# Patient Record
Sex: Male | Born: 1955 | Race: White | Hispanic: No | Marital: Married | Smoking: Former smoker
Health system: Southern US, Community
[De-identification: ages and names within clinical notes are randomized; demographics above are authoritative.]

## PROBLEM LIST (undated history)

## (undated) DIAGNOSIS — E785 Hyperlipidemia, unspecified: Secondary | ICD-10-CM

## (undated) DIAGNOSIS — T7840XA Allergy, unspecified, initial encounter: Secondary | ICD-10-CM

## (undated) HISTORY — DX: Allergy, unspecified, initial encounter: T78.40XA

## (undated) HISTORY — DX: Hyperlipidemia, unspecified: E78.5

---

## 1998-05-14 DIAGNOSIS — J301 Allergic rhinitis due to pollen: Secondary | ICD-10-CM | POA: Insufficient documentation

## 2004-05-14 HISTORY — PX: HERNIA REPAIR: SHX51

## 2005-03-02 ENCOUNTER — Ambulatory Visit: Payer: Self-pay | Admitting: General Surgery

## 2009-05-13 ENCOUNTER — Ambulatory Visit: Payer: Self-pay | Admitting: Gastroenterology

## 2009-07-18 DIAGNOSIS — R42 Dizziness and giddiness: Secondary | ICD-10-CM | POA: Insufficient documentation

## 2013-03-13 ENCOUNTER — Ambulatory Visit: Payer: Self-pay | Admitting: Unknown Physician Specialty

## 2013-03-13 LAB — HM COLONOSCOPY

## 2014-05-14 HISTORY — PX: COLONOSCOPY: SHX174

## 2014-12-29 ENCOUNTER — Other Ambulatory Visit: Payer: Self-pay | Admitting: Family Medicine

## 2015-01-31 ENCOUNTER — Other Ambulatory Visit: Payer: Self-pay | Admitting: Family Medicine

## 2015-01-31 ENCOUNTER — Telehealth: Payer: Self-pay | Admitting: Family Medicine

## 2015-01-31 ENCOUNTER — Encounter: Payer: Self-pay | Admitting: Family Medicine

## 2015-01-31 DIAGNOSIS — E782 Mixed hyperlipidemia: Secondary | ICD-10-CM | POA: Insufficient documentation

## 2015-01-31 DIAGNOSIS — E559 Vitamin D deficiency, unspecified: Secondary | ICD-10-CM | POA: Insufficient documentation

## 2015-01-31 DIAGNOSIS — E785 Hyperlipidemia, unspecified: Secondary | ICD-10-CM

## 2015-01-31 NOTE — Telephone Encounter (Signed)
Printed labs and placed them at the front desk.  Left message to pt to call back. Will try again later.  Thanks,

## 2015-01-31 NOTE — Telephone Encounter (Signed)
Pt would like to get a lab slip so he can have his labs done and the results back before his f/u appt on 02/07/15. Thanks TNP

## 2015-01-31 NOTE — Telephone Encounter (Signed)
Please order lipids, glucose, and TSH under hyperlipidemia, and vitamin d 25-oh under vitamin D deficiency.   I tried putting in this orders, but Epic would not let me sign them.   Thanks.

## 2015-02-02 LAB — VITAMIN D 25 HYDROXY (VIT D DEFICIENCY, FRACTURES): Vit D, 25-Hydroxy: 26.9 ng/mL — ABNORMAL LOW (ref 30.0–100.0)

## 2015-02-02 LAB — LIPID PANEL
CHOLESTEROL TOTAL: 198 mg/dL (ref 100–199)
Chol/HDL Ratio: 5 ratio units (ref 0.0–5.0)
HDL: 40 mg/dL (ref >39)
LDL CALC: 120 mg/dL — AB (ref 0–99)
TRIGLYCERIDES: 190 mg/dL — AB (ref 0–149)
VLDL CHOLESTEROL CAL: 38 mg/dL (ref 5–40)

## 2015-02-02 LAB — GLUCOSE, RANDOM: Glucose: 82 mg/dL (ref 65–99)

## 2015-02-02 LAB — TSH: TSH: 2.78 u[IU]/mL (ref 0.450–4.500)

## 2015-02-07 ENCOUNTER — Encounter: Payer: Self-pay | Admitting: Family Medicine

## 2015-02-07 ENCOUNTER — Ambulatory Visit (INDEPENDENT_AMBULATORY_CARE_PROVIDER_SITE_OTHER): Payer: Managed Care, Other (non HMO) | Admitting: Family Medicine

## 2015-02-07 VITALS — BP 120/70 | HR 68 | Temp 98.5°F | Resp 16 | Ht 68.0 in | Wt 188.0 lb

## 2015-02-07 DIAGNOSIS — E782 Mixed hyperlipidemia: Secondary | ICD-10-CM | POA: Diagnosis not present

## 2015-02-07 DIAGNOSIS — D229 Melanocytic nevi, unspecified: Secondary | ICD-10-CM | POA: Insufficient documentation

## 2015-02-07 DIAGNOSIS — H9319 Tinnitus, unspecified ear: Secondary | ICD-10-CM | POA: Insufficient documentation

## 2015-02-07 DIAGNOSIS — E559 Vitamin D deficiency, unspecified: Secondary | ICD-10-CM

## 2015-02-07 MED ORDER — ATORVASTATIN CALCIUM 40 MG PO TABS: 40.0000 mg | ORAL_TABLET | Freq: Every evening | ORAL | Status: DC

## 2015-02-07 NOTE — Progress Notes (Signed)
Patient ID: Derek Jimenez, male   DOB: 05/28/1955, 59 y.o.   MRN: [id number]        Patient: Derek Jimenez Male    DOB: Jan 16, 1956   59 y.o.   MRN: [id number] Visit Date: 02/07/2015  Today's Provider: Lelon Huh, MD   Chief Complaint  Patient presents with  . Hyperlipidemia   Subjective:    HPI   Lipid/Cholesterol, Follow-up:   Last seen for this1 years ago.  Management changes since that visit include none. . Last Lipid Panel:    Component Value Date/Time   CHOL 198 02/01/2015 1556   TRIG 190* 02/01/2015 1556   HDL 40 02/01/2015 1556   CHOLHDL 5.0 02/01/2015 1556   LDLCALC 120* 02/01/2015 1556    Risk factors for vascular disease include hypercholesterolemia  He reports excellent compliance with treatment. He is not having side effects.  Current symptoms include none and have been stable. Weight trend: stable Prior visit with dietician: no Current diet: in general, a "healthy" diet   Current exercise: walking  Wt Readings from Last 3 Encounters:  02/07/15 188 lb (85.276 kg)  11/18/13 186 lb (84.369 kg)    -------------------------------------------------------------------      Allergies  Allergen Reactions  . Meclizine Other (See Comments)   Previous Medications   ASPIRIN 81 MG TABLET    Take 1 tablet by mouth daily.   ATORVASTATIN (LIPITOR) 40 MG TABLET    TAKE ONE TABLET BY MOUTH IN THE EVENING   CHOLECALCIFEROL (VITAMIN D) 2000 UNITS TABLET    Take 1 tablet by mouth daily.    Review of Systems  Constitutional: Negative for fever, chills and appetite change.  Respiratory: Negative for chest tightness, shortness of breath and wheezing.   Cardiovascular: Negative for chest pain and palpitations.  Gastrointestinal: Negative for nausea, vomiting and abdominal pain.    Social History  Substance Use Topics  . Smoking status: Former Research scientist (life sciences)  . Smokeless tobacco: Never Used  . Alcohol Use: No   Objective:   BP 120/70 mmHg  Pulse 68  Temp(Src)  98.5 F (36.9 C) (Oral)  Resp 16  Ht 5\' 8"  (1.727 m)  Wt 188 lb (85.276 kg)  BMI 28.59 kg/m2  SpO2 97%  Physical Exam  General Appearance:    Alert, cooperative, no distress  Eyes:    PERRL, conjunctiva/corneas clear, EOM's intact       Lungs:     Clear to auscultation bilaterally, respirations unlabored  Heart:    Regular rate and rhythm  Neurologic:   Awake, alert, oriented x 3. No apparent focal neurological           defect.          Assessment & Plan:     1. Mixed hyperlipidemia He is tolerating atorvastatin well with no adverse effects.    2. Vitamin D deficiency Stable, continue current vitamin D supplement.        Lelon Huh, MD  [facility]

## 2015-08-29 ENCOUNTER — Encounter: Payer: Self-pay | Admitting: Family Medicine

## 2015-08-29 ENCOUNTER — Ambulatory Visit (INDEPENDENT_AMBULATORY_CARE_PROVIDER_SITE_OTHER): Payer: Managed Care, Other (non HMO) | Admitting: Family Medicine

## 2015-08-29 VITALS — BP 108/60 | HR 82 | Temp 98.2°F | Resp 16 | Wt 189.0 lb

## 2015-08-29 DIAGNOSIS — R1032 Left lower quadrant pain: Secondary | ICD-10-CM

## 2015-08-29 DIAGNOSIS — R1030 Lower abdominal pain, unspecified: Secondary | ICD-10-CM | POA: Diagnosis not present

## 2015-08-29 MED ORDER — METRONIDAZOLE 500 MG PO TABS: 500.0000 mg | ORAL_TABLET | Freq: Two times a day (BID) | ORAL | Status: AC

## 2015-08-29 MED ORDER — CIPROFLOXACIN HCL 500 MG PO TABS: 500.0000 mg | ORAL_TABLET | Freq: Two times a day (BID) | ORAL | Status: AC

## 2015-08-29 NOTE — Progress Notes (Signed)
Patient: Derek Jimenez Male    DOB: 1956/01/28   60 y.o.   MRN: [id number] Visit Date: 08/29/2015  Today's Provider: Lelon Huh, MD   Chief Complaint  Patient presents with  . Abdominal Pain   Subjective:    Abdominal Pain This is a recurrent problem. The current episode started more than 1 month ago (approximatly 2 months). The problem occurs intermittently. The problem has been gradually worsening. Pain location: left inguinal area and lower abdomen. The quality of the pain is dull and cramping. Associated symptoms include constipation and a fever. Pertinent negatives include no anorexia, arthralgias, belching, diarrhea, dysuria, flatus, frequency, headaches, hematuria, melena, myalgias, nausea or vomiting. The pain is aggravated by eating. Treatments tried: Laxative. The treatment provided mild relief. Inguinal hernia  Patient states he has trouble passing gas. He also states he feels a pouch like formation in his left inguinal area that seems to fills up with fluid. Patient says if he presses on that area the fluid passes out of the pouch and the pain is relieved. He has been more constipated the last few months, but states if he takes fiber supplements that pain seems to subside. He states that there is constant discomfort in the area, but that it flares up and becomes severe every couple of days. He feels a mild discomfort today.      Allergies  Allergen Reactions  . Meclizine Other (See Comments)   Previous Medications   ASPIRIN 81 MG TABLET    Take 1 tablet by mouth daily.   ATORVASTATIN (LIPITOR) 40 MG TABLET    Take 1 tablet (40 mg total) by mouth every evening.   CHOLECALCIFEROL (VITAMIN D) 2000 UNITS TABLET    Take 1 tablet by mouth daily.    Review of Systems  Constitutional: Positive for fever. Negative for chills, appetite change and fatigue.  Respiratory: Negative for chest tightness, shortness of breath and wheezing.   Cardiovascular: Negative for chest pain  and palpitations.  Gastrointestinal: Positive for abdominal pain, constipation and abdominal distention. Negative for nausea, vomiting, diarrhea, blood in stool, melena, anal bleeding, anorexia and flatus.  Genitourinary: Negative for dysuria, frequency and hematuria.  Musculoskeletal: Negative for myalgias and arthralgias.  Neurological: Negative for dizziness, light-headedness and headaches.    Social History  Substance Use Topics  . Smoking status: Former Research scientist (life sciences)  . Smokeless tobacco: Never Used  . Alcohol Use: No   Objective:   BP 108/60 mmHg  Pulse 82  Temp(Src) 98.2 F (36.8 C) (Oral)  Resp 16  Wt 189 lb (85.73 kg)  SpO2 96%  Physical Exam  General Appearance:    Alert, cooperative, no distress  Eyes:    PERRL, conjunctiva/corneas clear, EOM's intact       Lungs:     Clear to auscultation bilaterally, respirations unlabored  Heart:    Regular rate and rhythm  Abdomen:   bowel sounds present and normal in all 4 quadrants, round, nontender or nondistended. No CVA tenderness, no inguinal or femoral hernias noted        Assessment & Plan:      1. Left inguinal pain By history is very suspicious for inguinal hernia, which he has had repaired in the past, However no hernia is appreciated on exam. He also has a history of known rectal-sigmoid diverticulosis on colonoscopy in 2014. Discussed options of referral to surgeon, CT scan, or empirical treatment for diverticulitis. He prefers to try antibiotics for possible diverticulitis. If  not resolved after a week will proceed with CT scan.    - metroNIDAZOLE (FLAGYL) 500 MG tablet; Take 1 tablet (500 mg total) by mouth 2 (two) times daily.  Dispense: 20 tablet; Refill: 0 - ciprofloxacin (CIPRO) 500 MG tablet; Take 1 tablet (500 mg total) by mouth 2 (two) times daily.  Dispense: 20 tablet; Refill: 0        Lelon Huh, MD  [facility]

## 2015-08-29 NOTE — Patient Instructions (Signed)
Call if not much better within 7 days.

## 2015-09-19 ENCOUNTER — Telehealth: Payer: Self-pay | Admitting: Family Medicine

## 2015-09-19 DIAGNOSIS — R1032 Left lower quadrant pain: Secondary | ICD-10-CM

## 2015-09-19 NOTE — Telephone Encounter (Signed)
Please order CT pelvis for left inguinal pain. Rule out inguinal hernia/tumor/mass.

## 2015-09-19 NOTE — Telephone Encounter (Signed)
Pt states he was just seen 08/29/2015 for stomach pain.  Pt states he is still having stomach pain.  Pt states Dr Caryn Section had talked about doing a scan if he was still having pain.  Pt is requesting a scan ordered.  CB#[phone]/MW

## 2015-09-20 ENCOUNTER — Telehealth: Payer: Self-pay | Admitting: Family Medicine

## 2015-09-20 DIAGNOSIS — R1030 Lower abdominal pain, unspecified: Secondary | ICD-10-CM

## 2015-09-20 NOTE — Telephone Encounter (Signed)
Per tech at [facility] this test should be abd/pelvis with contrast,Thanks

## 2015-09-20 NOTE — Telephone Encounter (Signed)
[place] states this needs to be CT abd/pelvis,Thanks

## 2015-09-20 NOTE — Telephone Encounter (Signed)
Order in epic. 

## 2015-09-20 NOTE — Telephone Encounter (Signed)
New order placed

## 2015-09-22 ENCOUNTER — Ambulatory Visit
Admission: RE | Admit: 2015-09-22 | Discharge: 2015-09-22 | Disposition: A | Discharge status: Home / Self Care | Payer: Managed Care, Other (non HMO) | Source: Ambulatory Visit | Attending: Family Medicine | Admitting: Family Medicine

## 2015-09-22 ENCOUNTER — Other Ambulatory Visit: Payer: Self-pay | Admitting: Family Medicine

## 2015-09-22 DIAGNOSIS — R1030 Lower abdominal pain, unspecified: Secondary | ICD-10-CM | POA: Diagnosis present

## 2015-09-22 DIAGNOSIS — I2510 Atherosclerotic heart disease of native coronary artery without angina pectoris: Secondary | ICD-10-CM | POA: Diagnosis not present

## 2015-09-22 DIAGNOSIS — K4021 Bilateral inguinal hernia, without obstruction or gangrene, recurrent: Secondary | ICD-10-CM

## 2015-09-22 DIAGNOSIS — K4020 Bilateral inguinal hernia, without obstruction or gangrene, not specified as recurrent: Secondary | ICD-10-CM | POA: Insufficient documentation

## 2015-09-22 LAB — POCT I-STAT CREATININE: CREATININE: 0.9 mg/dL (ref 0.61–1.24)

## 2015-09-22 MED ORDER — IOPAMIDOL (ISOVUE-300) INJECTION 61%
100.0000 mL | Freq: Once | INTRAVENOUS | Status: AC | PRN
  Administered 2015-09-22: 100 mL via INTRAVENOUS

## 2015-09-22 NOTE — Progress Notes (Unsigned)
Please refer surgery for bilateral hernia

## 2015-10-04 ENCOUNTER — Encounter: Payer: Self-pay | Admitting: General Surgery

## 2015-10-04 ENCOUNTER — Ambulatory Visit (INDEPENDENT_AMBULATORY_CARE_PROVIDER_SITE_OTHER): Payer: Managed Care, Other (non HMO) | Admitting: General Surgery

## 2015-10-04 VITALS — BP 130/76 | HR 68 | Resp 14 | Ht 68.0 in | Wt 185.0 lb

## 2015-10-04 DIAGNOSIS — K4020 Bilateral inguinal hernia, without obstruction or gangrene, not specified as recurrent: Secondary | ICD-10-CM

## 2015-10-04 NOTE — Progress Notes (Signed)
Patient ID: Derek Jimenez, male   DOB: 08/27/1955, 60 y.o.   MRN: [id number]  Chief Complaint  Patient presents with  . Inguinal Hernia    HPI Derek Jimenez is a 60 y.o. male here for evaluation of possible bilateral inguinal hernias. CT scan done 09-22-15. He states he has noticed a knot in the left groin about 3 months. He states it is tender and it swells after meals and he has to push it back in. He states he noticed it got worse after he was lifting some shingles helping his son with roofing 2 months ago. I have reviewed the history of present illness with the patient.   HPI  Past Medical History  Diagnosis Date  . Allergy   . Hyperlipidemia     Past Surgical History  Procedure Laterality Date  . Colonoscopy  2016  . Hernia repair  2006    Family History  Problem Relation Age of Onset  . Diabetes Mother     boderline    Social History Social History  Substance Use Topics  . Smoking status: Former Research scientist (life sciences)  . Smokeless tobacco: Never Used  . Alcohol Use: No    Allergies  Allergen Reactions  . Meclizine Other (See Comments)    Current Outpatient Prescriptions  Medication Sig Dispense Refill  . aspirin 81 MG tablet Take 1 tablet by mouth daily.    Marland Kitchen atorvastatin (LIPITOR) 40 MG tablet Take 1 tablet (40 mg total) by mouth every evening. 30 tablet 12  . Cholecalciferol (VITAMIN D) 2000 UNITS tablet Take 1 tablet by mouth daily.     No current facility-administered medications for this visit.    Review of Systems Review of Systems  Constitutional: Negative.   Respiratory: Negative.   Cardiovascular: Negative.   Gastrointestinal: Negative.     Blood pressure 130/76, pulse 68, resp. rate 14, height 5\' 8"  (1.727 m), weight 185 lb (83.915 kg).  Physical Exam Physical Exam  Constitutional: He is oriented to person, place, and time. He appears well-developed and well-nourished.  Eyes: Conjunctivae are normal. No scleral icterus.  Neck: Neck supple.   Cardiovascular: Normal rate, regular rhythm and normal heart sounds.   Pulmonary/Chest: Breath sounds normal.  Abdominal: Soft. Normal appearance and bowel sounds are normal. There is no hepatomegaly. There is no tenderness. A hernia is present. Hernia confirmed positive in the right inguinal area and confirmed positive in the left inguinal area.  Both inguinal hernias are reducible.   Lymphadenopathy:    He has no cervical adenopathy.  Neurological: He is alert and oriented to person, place, and time.  Skin: Skin is warm and dry.    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 If they develop symptoms of an incarcerated hernia, they were encouraged to seek prompt medical attention.  I have recommended repair of the hernia using mesh on an outpatient basis in the near future. The risk of infection was reviewed. The role of prosthetic mesh to minimize the risk of recurrence was reviewed.    Patient is scheduled for surgery at [facility] on 10/21/15. He will pre admit by phone. Patient is aware of date and instructions.   PCP: Lelon Huh This information has been scribed by Karie Fetch RN, BSN,BC.   Tijuan Dantes G 10/04/2015, 12:50 PM

## 2015-10-04 NOTE — Patient Instructions (Addendum)

## 2015-10-14 ENCOUNTER — Encounter: Payer: Self-pay | Admitting: *Deleted

## 2015-10-14 ENCOUNTER — Other Ambulatory Visit: Payer: Managed Care, Other (non HMO)

## 2015-10-14 NOTE — Patient Instructions (Signed)
  Your procedure is scheduled on: 10/21/15 Report to Day Surgery. [facility]OND [facility]LOOR To find out your arrival time please call [phone] between 1PM - 3PM on 10/20/15.  Remember: Instructions that are not followed completely may result in serious medical risk, up to and including death, or upon the discretion of your surgeon and anesthesiologist your surgery may need to be rescheduled.    _X___ 1. Do not eat food or drink liquids after midnight. No gum chewing or hard candies.     _X___ 2. No Alcohol for 24 hours before or after surgery.   ____ 3. Bring all medications with you on the day of surgery if instructed.    __X__ 4. Notify your doctor if there is any change in your medical condition     (cold, fever, infections).     Do not wear jewelry, make-up, hairpins, clips or nail polish.  Do not wear lotions, powders, or perfumes. You may wear deodorant.  Do not shave 48 hours prior to surgery. Men may shave face and neck.  Do not bring valuables to the hospital.    [facility] is not responsible for any belongings or valuables.               Contacts, dentures or bridgework may not be worn into surgery.  Leave your suitcase in the car. After surgery it may be brought to your room.  For patients admitted to the hospital, discharge time is determined by your                treatment team.   Patients discharged the day of surgery will not be allowed to drive home.   Please read over the following fact sheets that you were given:   Surgical Site Infection Prevention   ___X_ Take these medicines the morning of surgery with A SIP OF WATER:    1. LIPITOR  2.   3.   4.  5.  6.  ____ Fleet Enema (as directed)   ____ Use CHG Soap as directed  ____ Use inhalers on the day of surgery  ____ Stop metformin 2 days prior to surgery    ____ Take 1/2 of usual insulin dose the night before surgery and none on the morning of surgery.   ____ Stop Coumadin/Plavix/aspirin on    ____ Stop Anti-inflammatories on   ____ Stop supplements until after surgery.    ____ Bring C-Pap to the hospital.

## 2015-10-21 ENCOUNTER — Ambulatory Visit
Admission: RE | Admit: 2015-10-21 | Discharge: 2015-10-21 | Disposition: A | Discharge status: Home / Self Care | Payer: Managed Care, Other (non HMO) | Source: Ambulatory Visit | Attending: General Surgery | Admitting: General Surgery

## 2015-10-21 ENCOUNTER — Encounter
Admission: RE | Disposition: A | Discharge status: Home / Self Care | Payer: Self-pay | Source: Ambulatory Visit | Attending: General Surgery

## 2015-10-21 ENCOUNTER — Ambulatory Visit: Payer: Managed Care, Other (non HMO) | Admitting: Anesthesiology

## 2015-10-21 DIAGNOSIS — K219 Gastro-esophageal reflux disease without esophagitis: Secondary | ICD-10-CM | POA: Diagnosis not present

## 2015-10-21 DIAGNOSIS — K4090 Unilateral inguinal hernia, without obstruction or gangrene, not specified as recurrent: Secondary | ICD-10-CM | POA: Diagnosis not present

## 2015-10-21 DIAGNOSIS — K449 Diaphragmatic hernia without obstruction or gangrene: Secondary | ICD-10-CM | POA: Diagnosis not present

## 2015-10-21 DIAGNOSIS — Z7982 Long term (current) use of aspirin: Secondary | ICD-10-CM | POA: Diagnosis not present

## 2015-10-21 DIAGNOSIS — Z833 Family history of diabetes mellitus: Secondary | ICD-10-CM | POA: Insufficient documentation

## 2015-10-21 DIAGNOSIS — E785 Hyperlipidemia, unspecified: Secondary | ICD-10-CM | POA: Insufficient documentation

## 2015-10-21 DIAGNOSIS — K4020 Bilateral inguinal hernia, without obstruction or gangrene, not specified as recurrent: Secondary | ICD-10-CM | POA: Insufficient documentation

## 2015-10-21 DIAGNOSIS — K4091 Unilateral inguinal hernia, without obstruction or gangrene, recurrent: Secondary | ICD-10-CM | POA: Diagnosis not present

## 2015-10-21 DIAGNOSIS — Z87891 Personal history of nicotine dependence: Secondary | ICD-10-CM | POA: Diagnosis not present

## 2015-10-21 DIAGNOSIS — Z79899 Other long term (current) drug therapy: Secondary | ICD-10-CM | POA: Insufficient documentation

## 2015-10-21 HISTORY — PX: INGUINAL HERNIA REPAIR: SHX194

## 2015-10-21 SURGERY — REPAIR, HERNIA, INGUINAL, BILATERAL, ADULT
Anesthesia: General | Laterality: Bilateral | Wound class: Clean

## 2015-10-21 MED ORDER — ONDANSETRON HCL 4 MG/2ML IJ SOLN
4.0000 mg | Freq: Once | INTRAMUSCULAR | Status: AC | PRN
  Administered 2015-10-21: 4 mg via INTRAVENOUS

## 2015-10-21 MED ORDER — OXYCODONE-ACETAMINOPHEN 5-325 MG PO TABS: 1.0000 | ORAL_TABLET | ORAL | Status: DC | PRN

## 2015-10-21 MED ORDER — ONDANSETRON HCL 4 MG/2ML IJ SOLN
INTRAMUSCULAR | Status: AC
  Administered 2015-10-21: 4 mg via INTRAVENOUS
  Filled 2015-10-21: qty 2

## 2015-10-21 MED ORDER — MIDAZOLAM HCL 2 MG/2ML IJ SOLN
INTRAMUSCULAR | Status: DC | PRN
  Administered 2015-10-21: 2 mg via INTRAVENOUS

## 2015-10-21 MED ORDER — ACETAMINOPHEN 10 MG/ML IV SOLN
INTRAVENOUS | Status: AC
  Filled 2015-10-21: qty 100

## 2015-10-21 MED ORDER — OXYCODONE-ACETAMINOPHEN 5-325 MG PO TABS
ORAL_TABLET | ORAL | Status: AC
  Filled 2015-10-21: qty 1

## 2015-10-21 MED ORDER — OXYCODONE-ACETAMINOPHEN 5-325 MG PO TABS
1.0000 | ORAL_TABLET | Freq: Once | ORAL | Status: AC
  Administered 2015-10-21: 1 via ORAL

## 2015-10-21 MED ORDER — FENTANYL CITRATE (PF) 100 MCG/2ML IJ SOLN
INTRAMUSCULAR | Status: DC | PRN
  Administered 2015-10-21: 100 ug via INTRAVENOUS
  Administered 2015-10-21 (×2): 50 ug via INTRAVENOUS

## 2015-10-21 MED ORDER — FAMOTIDINE 20 MG PO TABS
20.0000 mg | ORAL_TABLET | Freq: Once | ORAL | Status: AC
  Administered 2015-10-21: 20 mg via ORAL

## 2015-10-21 MED ORDER — DEXAMETHASONE SODIUM PHOSPHATE 10 MG/ML IJ SOLN
INTRAMUSCULAR | Status: DC | PRN
Start: 2015-10-21 — End: 2015-10-21
  Administered 2015-10-21: 5 mg via INTRAVENOUS

## 2015-10-21 MED ORDER — EPHEDRINE SULFATE 50 MG/ML IJ SOLN
INTRAMUSCULAR | Status: DC | PRN
  Administered 2015-10-21: 7.5 mg via INTRAVENOUS
  Administered 2015-10-21: 10 mg via INTRAVENOUS

## 2015-10-21 MED ORDER — FENTANYL CITRATE (PF) 100 MCG/2ML IJ SOLN
INTRAMUSCULAR | Status: AC
  Administered 2015-10-21: 50 ug via INTRAVENOUS
  Filled 2015-10-21: qty 2

## 2015-10-21 MED ORDER — LIDOCAINE HCL (CARDIAC) 20 MG/ML IV SOLN
INTRAVENOUS | Status: DC | PRN
  Administered 2015-10-21: 40 mg via INTRAVENOUS

## 2015-10-21 MED ORDER — SUGAMMADEX SODIUM 200 MG/2ML IV SOLN
INTRAVENOUS | Status: DC | PRN
  Administered 2015-10-21: 190 mg via INTRAVENOUS

## 2015-10-21 MED ORDER — LACTATED RINGERS IV SOLN
INTRAVENOUS | Status: DC
  Administered 2015-10-21 (×2): via INTRAVENOUS

## 2015-10-21 MED ORDER — ACETAMINOPHEN 10 MG/ML IV SOLN
INTRAVENOUS | Status: DC | PRN
  Administered 2015-10-21: 1000 mg via INTRAVENOUS

## 2015-10-21 MED ORDER — FAMOTIDINE 20 MG PO TABS
ORAL_TABLET | ORAL | Status: AC
  Administered 2015-10-21: 20 mg via ORAL
  Filled 2015-10-21: qty 1

## 2015-10-21 MED ORDER — CEFAZOLIN SODIUM-DEXTROSE 2-4 GM/100ML-% IV SOLN
2.0000 g | INTRAVENOUS | Status: AC
  Administered 2015-10-21: 2 g via INTRAVENOUS

## 2015-10-21 MED ORDER — CEFAZOLIN SODIUM-DEXTROSE 2-4 GM/100ML-% IV SOLN
INTRAVENOUS | Status: AC
  Filled 2015-10-21: qty 100

## 2015-10-21 MED ORDER — FENTANYL CITRATE (PF) 100 MCG/2ML IJ SOLN
25.0000 ug | INTRAMUSCULAR | Status: DC | PRN
  Administered 2015-10-21 (×2): 50 ug via INTRAVENOUS

## 2015-10-21 MED ORDER — CHLORHEXIDINE GLUCONATE 4 % EX LIQD
1.0000 "application " | Freq: Once | CUTANEOUS | Status: AC
  Administered 2015-10-21: 1 via TOPICAL

## 2015-10-21 MED ORDER — ONDANSETRON HCL 4 MG/2ML IJ SOLN
INTRAMUSCULAR | Status: DC | PRN
  Administered 2015-10-21: 4 mg via INTRAVENOUS

## 2015-10-21 MED ORDER — ROCURONIUM BROMIDE 100 MG/10ML IV SOLN
INTRAVENOUS | Status: DC | PRN
  Administered 2015-10-21: 20 mg via INTRAVENOUS
  Administered 2015-10-21: 5 mg via INTRAVENOUS
  Administered 2015-10-21: 15 mg via INTRAVENOUS
  Administered 2015-10-21: 25 mg via INTRAVENOUS

## 2015-10-21 MED ORDER — PROPOFOL 10 MG/ML IV BOLUS
INTRAVENOUS | Status: DC | PRN
  Administered 2015-10-21: 150 mg via INTRAVENOUS

## 2015-10-21 SURGICAL SUPPLY — 35 items
BLADE SURG 11 STRL SS SAFETY (MISCELLANEOUS) ×3 IMPLANT
CANISTER SUCT 1200ML W/VALVE (MISCELLANEOUS) ×3 IMPLANT
CATH TRAY 16F METER LATEX (MISCELLANEOUS) ×3 IMPLANT
CHLORAPREP W/TINT 26ML (MISCELLANEOUS) ×3 IMPLANT
CORD MONOPOLAR M/FML 12FT (MISCELLANEOUS) ×3 IMPLANT
CUTTER FLEX LINEAR 45M (STAPLE) ×3 IMPLANT
DEFOGGER SCOPE WARMER CLEARIFY (MISCELLANEOUS) ×3 IMPLANT
DEVICE SECURE STRAP 25 ABSORB (INSTRUMENTS) ×6 IMPLANT
DRAPE INCISE IOBAN 66X45 STRL (DRAPES) ×3 IMPLANT
ELECT REM PT RETURN 9FT ADLT (ELECTROSURGICAL) ×3
ELECTRODE REM PT RTRN 9FT ADLT (ELECTROSURGICAL) ×1 IMPLANT
GLOVE BIO SURGEON STRL SZ7 (GLOVE) ×24 IMPLANT
GOWN STRL REUS W/ TWL LRG LVL3 (GOWN DISPOSABLE) ×4 IMPLANT
GOWN STRL REUS W/TWL LRG LVL3 (GOWN DISPOSABLE) ×8
GRASPER SUT TROCAR 14GX15 (MISCELLANEOUS) ×3 IMPLANT
IRRIGATION STRYKERFLOW (MISCELLANEOUS) ×1 IMPLANT
IRRIGATOR STRYKERFLOW (MISCELLANEOUS) ×3
IV LACTATED RINGERS 1000ML (IV SOLUTION) ×3 IMPLANT
KIT RM TURNOVER STRD PROC AR (KITS) ×3 IMPLANT
LABEL OR SOLS (LABEL) ×3 IMPLANT
LIQUID BAND (GAUZE/BANDAGES/DRESSINGS) ×3 IMPLANT
MESH 3DMAX 3X5 LT MED (Mesh General) ×3 IMPLANT
MESH 3DMAX 3X5 RT MED (Mesh General) ×3 IMPLANT
NEEDLE VERESS 14GA 120MM (NEEDLE) ×3 IMPLANT
PACK LAP CHOLECYSTECTOMY (MISCELLANEOUS) ×3 IMPLANT
RELOAD STAPLE TA45 3.5 REG BLU (ENDOMECHANICALS) ×3 IMPLANT
SCISSORS METZENBAUM CVD 33 (INSTRUMENTS) ×3 IMPLANT
SHEARS HARMONIC ACE PLUS 36CM (ENDOMECHANICALS) IMPLANT
SLEEVE ENDOPATH XCEL 5M (ENDOMECHANICALS) ×3 IMPLANT
SUT VIC AB 0 CT2 27 (SUTURE) ×3 IMPLANT
SUT VIC AB 4-0 FS2 27 (SUTURE) ×3 IMPLANT
TROCAR XCEL 12X100 BLDLESS (ENDOMECHANICALS) ×3 IMPLANT
TROCAR XCEL NON-BLD 11X100MML (ENDOMECHANICALS) ×3 IMPLANT
TROCAR XCEL NON-BLD 5MMX100MML (ENDOMECHANICALS) ×3 IMPLANT
TUBING INSUFFLATOR HI FLOW (MISCELLANEOUS) ×3 IMPLANT

## 2015-10-21 NOTE — Interval H&P Note (Signed)
History and Physical Interval Note:  10/21/2015 7:02 AM  Derek Jimenez  has presented today for surgery, with the diagnosis of recurrent left and right inguinal hernia  The various methods of treatment have been discussed with the patient and family. After consideration of risks, benefits and other options for treatment, the patient has consented to  Procedure(s): HERNIA REPAIR INGUINAL ADULT BILATERAL (Bilateral) as a surgical intervention .  The patient's history has been reviewed, patient examined, no change in status, stable for surgery.  I have reviewed the patient's chart and labs.  Questions were answered to the patient's satisfaction.     SANKAR,SEEPLAPUTHUR G

## 2015-10-21 NOTE — Anesthesia Preprocedure Evaluation (Addendum)
Anesthesia Evaluation  Patient identified by MRN, date of birth, ID band Patient awake    Reviewed: Allergy & Precautions, H&P , NPO status , Patient's Chart, lab work & pertinent test results, reviewed documented beta blocker date and time   History of Anesthesia Complications Negative for: history of anesthetic complications  Airway Mallampati: III  TM Distance: >3 FB Neck ROM: full    Dental no notable dental hx. (+) Teeth Intact   Pulmonary neg pulmonary ROS, former smoker,    Pulmonary exam normal breath sounds clear to auscultation       Cardiovascular Exercise Tolerance: Good negative cardio ROS Normal cardiovascular exam Rhythm:regular Rate:Normal     Neuro/Psych negative neurological ROS  negative psych ROS   GI/Hepatic Neg liver ROS, hiatal hernia, GERD  ,  Endo/Other  negative endocrine ROS  Renal/GU negative Renal ROS  negative genitourinary   Musculoskeletal   Abdominal   Peds  Hematology negative hematology ROS (+)   Anesthesia Other Findings Past Medical History:   Allergy                                                      Hyperlipidemia                                               Reproductive/Obstetrics negative OB ROS                            Anesthesia Physical Anesthesia Plan  ASA: II  Anesthesia Plan: General   Post-op Pain Management:    Induction:   Airway Management Planned:   Additional Equipment:   Intra-op Plan:   Post-operative Plan:   Informed Consent: I have reviewed the patients History and Physical, chart, labs and discussed the procedure including the risks, benefits and alternatives for the proposed anesthesia with the patient or authorized representative who has indicated his/her understanding and acceptance.   Dental Advisory Given  Plan Discussed with: Anesthesiologist, CRNA and Surgeon  Anesthesia Plan Comments:         Anesthesia Quick Evaluation

## 2015-10-21 NOTE — Anesthesia Procedure Notes (Signed)
Procedure Name: Intubation Date/Time: 10/21/2015 7:33 AM Performed by: Kennon Holter Pre-anesthesia Checklist: Patient identified, Patient being monitored, Timeout performed, Emergency Drugs available and Suction available Patient Re-evaluated:Patient Re-evaluated prior to inductionOxygen Delivery Method: Circle system utilized Preoxygenation: Pre-oxygenation with 100% oxygen Intubation Type: IV induction Ventilation: Mask ventilation without difficulty Laryngoscope Size: Miller and 2 Grade View: Grade II Tube type: Oral Tube size: 7.0 mm Number of attempts: 1 Airway Equipment and Method: Stylet Placement Confirmation: ETT inserted through vocal cords under direct vision,  positive ETCO2 and breath sounds checked- equal and bilateral Secured at: 21 cm Tube secured with: Tape Dental Injury: Teeth and Oropharynx as per pre-operative assessment

## 2015-10-21 NOTE — Anesthesia Postprocedure Evaluation (Signed)
Anesthesia Post Note  Patient: Derek Jimenez  Procedure(s) Performed: Procedure(s) (LRB): Laparoscopic bilateral inguinal hernia repair (Bilateral)  Patient location during evaluation: PACU Anesthesia Type: General Level of consciousness: awake and alert Pain management: pain level controlled Vital Signs Assessment: post-procedure vital signs reviewed and stable Respiratory status: spontaneous breathing, nonlabored ventilation, respiratory function stable and patient connected to nasal cannula oxygen Cardiovascular status: blood pressure returned to baseline and stable Postop Assessment: no signs of nausea or vomiting Anesthetic complications: no    Last Vitals:  Filed Vitals:   10/21/15 1053 10/21/15 1203  BP: 124/73 122/73  Pulse: 76 77  Temp: 36.5 C 36.3 C  Resp: 14 14    Last Pain:  Filed Vitals:   10/21/15 1204  PainSc: 5                  Martha Clan

## 2015-10-21 NOTE — Transfer of Care (Signed)
Immediate Anesthesia Transfer of Care Note  Patient: Derek Jimenez  Procedure(s) Performed: Procedure(s): Laparoscopic bilateral inguinal hernia repair (Bilateral)  Patient Location: PACU  Anesthesia Type:General  Level of Consciousness: awake, alert  and oriented  Airway & Oxygen Therapy: Patient Spontanous Breathing and Patient connected to face mask oxygen  Post-op Assessment: Report given to RN and Post -op Vital signs reviewed and stable  Post vital signs: Reviewed and stable  Last Vitals:  Filed Vitals:   10/21/15 0620 10/21/15 0946  BP: 134/92 136/90  Pulse: 76 84  Temp: 35.9 C 36.4 C  Resp: 16 16    Last Pain: There were no vitals filed for this visit.       Complications: No apparent anesthesia complications

## 2015-10-21 NOTE — Discharge Instructions (Signed)
AMBULATORY SURGERY  DISCHARGE INSTRUCTIONS   1) The drugs that you were given will stay in your system until tomorrow so for the next 24 hours you should not:  A) Drive an automobile B) Make any legal decisions C) Drink any alcoholic beverage   2) You may resume regular meals tomorrow.  Today it is better to start with liquids and gradually work up to solid foods.  You may eat anything you prefer, but it is better to start with liquids, then soup and crackers, and gradually work up to solid foods.   3) Please notify your doctor immediately if you have any unusual bleeding, trouble breathing, redness and pain at the surgery site, drainage, fever, or pain not relieved by medication.    4) Additional Instructions:        Please contact your physician with any problems or Same Day Surgery at [phone], Monday through Friday 6 am to 4 pm, or [place] [facility] number at [phone]. Inguinal Hernia, Adult Muscles help keep everything in the body in its proper place. But if a weak spot in the muscles develops, something can poke through. That is called a hernia. When this happens in the lower part of the belly (abdomen), it is called an inguinal hernia. (It takes its name from a part of the body in this region called the inguinal canal.) A weak spot in the wall of muscles lets some fat or part of the small intestine bulge through. An inguinal hernia can develop at any age. Men get them more often than women. CAUSES  In adults, an inguinal hernia develops over time.  It can be triggered by:  Suddenly straining the muscles of the lower abdomen.  Lifting heavy objects.  Straining to have a bowel movement. Difficult bowel movements (constipation) can lead to this.  Constant coughing. This may be caused by smoking or lung disease.  Being overweight.  Being pregnant.  Working at a job that requires long periods of standing or heavy lifting.  Having had an inguinal  hernia before. One type can be an emergency situation. It is called a strangulated inguinal hernia. It develops if part of the small intestine slips through the weak spot and cannot get back into the abdomen. The blood supply can be cut off. If that happens, part of the intestine may die. This situation requires emergency surgery. SYMPTOMS  Often, a small inguinal hernia has no symptoms. It is found when a healthcare provider does a physical exam. Larger hernias usually have symptoms.   In adults, symptoms may include:  A lump in the groin. This is easier to see when the person is standing. It might disappear when lying down.  In men, a lump in the scrotum.  Pain or burning in the groin. This occurs especially when lifting, straining or coughing.  A dull ache or feeling of pressure in the groin.  Signs of a strangulated hernia can include:  A bulge in the groin that becomes very painful and tender to the touch.  A bulge that turns red or purple.  Fever, nausea and vomiting.  Inability to have a bowel movement or to pass gas. DIAGNOSIS  To decide if you have an inguinal hernia, a healthcare provider will probably do a physical examination.  This will include asking questions about any symptoms you have noticed.  The healthcare provider might feel the groin area and ask you to cough. If an inguinal hernia is felt, the healthcare provider may  try to slide it back into the abdomen.  Usually no other tests are needed. TREATMENT  Treatments can vary. The size of the hernia makes a difference. Options include:  Watchful waiting. This is often suggested if the hernia is small and you have had no symptoms.  No medical procedure will be done unless symptoms develop.  You will need to watch closely for symptoms. If any occur, contact your healthcare provider right away.  Surgery. This is used if the hernia is larger or you have symptoms.  Open surgery. This is usually an outpatient  procedure (you will not stay overnight in a hospital). An cut (incision) is made through the skin in the groin. The hernia is put back inside the abdomen. The weak area in the muscles is then repaired by herniorrhaphy or hernioplasty. Herniorrhaphy: in this type of surgery, the weak muscles are sewn back together. Hernioplasty: a patch or mesh is used to close the weak area in the abdominal wall.  Laparoscopy. In this procedure, a surgeon makes small incisions. A thin tube with a tiny video camera (called a laparoscope) is put into the abdomen. The surgeon repairs the hernia with mesh by looking with the video camera and using two long instruments. HOME CARE INSTRUCTIONS   After surgery to repair an inguinal hernia:  You will need to take pain medicine prescribed by your healthcare provider. Follow all directions carefully.  You will need to take care of the wound from the incision.  Your activity will be restricted for awhile. This will probably include no heavy lifting for several weeks. You also should not do anything too active for a few weeks. When you can return to work will depend on the type of job that you have.  During "watchful waiting" periods, you should:  Maintain a healthy weight.  Eat a diet high in fiber (fruits, vegetables and whole grains).  Drink plenty of fluids to avoid constipation. This means drinking enough water and other liquids to keep your urine clear or pale yellow.  Do not lift heavy objects.  Do not stand for long periods of time.  Quit smoking. This should keep you from developing a frequent cough. SEEK MEDICAL CARE IF:   A bulge develops in your groin area.  You feel pain, a burning sensation or pressure in the groin. This might be worse if you are lifting or straining.  You develop a fever of more than 100.5 F (38.1 C). SEEK IMMEDIATE MEDICAL CARE IF:   Pain in the groin increases suddenly.  A bulge in the groin gets bigger suddenly and does  not go down.  For men, there is sudden pain in the scrotum. Or, the size of the scrotum increases.  A bulge in the groin area becomes red or purple and is painful to touch.  You have nausea or vomiting that does not go away.  You feel your heart beating much faster than normal.  You cannot have a bowel movement or pass gas.  You develop a fever of more than 102.0 F (38.9 C).   This information is not intended to replace advice given to you by your health care provider. Make sure you discuss any questions you have with your health care provider.   Document Released: 09/16/2008 Document Revised: 07/23/2011 Document Reviewed: 11/01/2014 Elsevier Interactive Patient Education [employer].

## 2015-10-21 NOTE — H&P (View-Only) (Signed)
Patient ID: Derek Jimenez, male   DOB: Sep 22, 1955, 60 y.o.   MRN: [id number]  Chief Complaint  Patient presents with  . Inguinal Hernia    HPI Derek Jimenez is a 60 y.o. male here for evaluation of possible bilateral inguinal hernias. CT scan done 09-22-15. He states he has noticed a knot in the left groin about 3 months. He states it is tender and it swells after meals and he has to push it back in. He states he noticed it got worse after he was lifting some shingles helping his son with roofing 2 months ago. I have reviewed the history of present illness with the patient.   HPI  Past Medical History  Diagnosis Date  . Allergy   . Hyperlipidemia     Past Surgical History  Procedure Laterality Date  . Colonoscopy  2016  . Hernia repair  2006    Family History  Problem Relation Age of Onset  . Diabetes Mother     boderline    Social History Social History  Substance Use Topics  . Smoking status: Former Research scientist (life sciences)  . Smokeless tobacco: Never Used  . Alcohol Use: No    Allergies  Allergen Reactions  . Meclizine Other (See Comments)    Current Outpatient Prescriptions  Medication Sig Dispense Refill  . aspirin 81 MG tablet Take 1 tablet by mouth daily.    Marland Kitchen atorvastatin (LIPITOR) 40 MG tablet Take 1 tablet (40 mg total) by mouth every evening. 30 tablet 12  . Cholecalciferol (VITAMIN D) 2000 UNITS tablet Take 1 tablet by mouth daily.     No current facility-administered medications for this visit.    Review of Systems Review of Systems  Constitutional: Negative.   Respiratory: Negative.   Cardiovascular: Negative.   Gastrointestinal: Negative.     Blood pressure 130/76, pulse 68, resp. rate 14, height 5\' 8"  (1.727 m), weight 185 lb (83.915 kg).  Physical Exam Physical Exam  Constitutional: He is oriented to person, place, and time. He appears well-developed and well-nourished.  Eyes: Conjunctivae are normal. No scleral icterus.  Neck: Neck supple.   Cardiovascular: Normal rate, regular rhythm and normal heart sounds.   Pulmonary/Chest: Breath sounds normal.  Abdominal: Soft. Normal appearance and bowel sounds are normal. There is no hepatomegaly. There is no tenderness. A hernia is present. Hernia confirmed positive in the right inguinal area and confirmed positive in the left inguinal area.  Both inguinal hernias are reducible.   Lymphadenopathy:    He has no cervical adenopathy.  Neurological: He is alert and oriented to person, place, and time.  Skin: Skin is warm and dry.    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 If they develop symptoms of an incarcerated hernia, they were encouraged to seek prompt medical attention.  I have recommended repair of the hernia using mesh on an outpatient basis in the near future. The risk of infection was reviewed. The role of prosthetic mesh to minimize the risk of recurrence was reviewed.    Patient is scheduled for surgery at [facility] on 10/21/15. He will pre admit by phone. Patient is aware of date and instructions.   PCP: Lelon Huh This information has been scribed by Karie Fetch RN, BSN,BC.   Derek Jimenez 10/04/2015, 12:50 PM

## 2015-10-21 NOTE — Op Note (Signed)
Preop diagnosis: Bilateral inguinal hernias left side recurrent  Post op diagnosis: Same   Operation:  laparoscopy repair bilateral inguinal hernias with Bard 3-D mesh  Surgeon: Mckinley Jewel  Assistant:     Anesthesia: Gen.  Complications: None  EBL: Less than 25 mL  Drains: None  Description: Patient was put to sleep in the supine position the operating table. The abdomen was prepped and draped sterile field after Foley catheter was inserted. Foley catheter was removed at the end of the procedure. Timeout was performed. Initial port incision was made just above the umbilicus with a 1 cm incision and the Veress needle position the peritoneal cavity verified of the hanging drop method. Pneumoperitoneum was obtained and subsequent almost millimeter port was placed. Camera was introduced with good visualization of the peritoneal cavity. There was scarring identified in the left inguinal area where the sigmoid colon was pulled up with a single band of adhesion. On the right side there appeared to be a indirect type hernia. 2 lateral 5 mm ports were placed. The left side was operated on first. The peritoneum was opened along the upper portion of the inguinal canal area. The sigmoid adhesion was taken down with cautery. The peritoneum was then reflected off from the inguinal space and foot continued dissection it was apparent that the patient had what appeared to be a small direct hernia. The axis of fatty tissue though was herniating an indirect component was freed up and removed. The retroperitoneal space was then well outlined and after ensuring proper hemostasis the area was irrigated with a small amount of fluid the pubic tubercle and the inguinal ligament were easily identified and a Bard 3-D mesh was then positioned. This was then tacked with the the secure strap to the pubic tubercle and the inguinal ligament lobe and along the medial edge and a couple on the superior aspect but none laterally.  The peritoneum was then used to cover this mesh may reapproximating it with also the secure strap. The right side was operated on thereafter Once again the peritoneum was scored along the superior edge of the inguinal region and dissected down to expose the inguinal canal area the indirect hernia was identified which was freed and pulled away from the inguinal canal leacving behind just the spermatic cord. Pubic tubercle and the inguinal ligament are adequately exposed and then again a Bard 3-D mesh was then placed across this area and tacked down in similar to the left side with the secure strap. Peritoneum was reapproximated to cover the mesh with secure strap. Ports were then removed and pneumoperitoneum was released. The port incision and the above the umbilicus was closed with a 0 Vicryl. All the other skin incisions were closed with subcuticular sutures of 4-0 Vicryl. Liquid ban was applied. Patient subsequently returned to PACU in stable condition

## 2015-10-26 ENCOUNTER — Encounter: Payer: Self-pay | Admitting: *Deleted

## 2015-10-26 ENCOUNTER — Encounter: Payer: Self-pay | Admitting: General Surgery

## 2015-10-27 ENCOUNTER — Ambulatory Visit (INDEPENDENT_AMBULATORY_CARE_PROVIDER_SITE_OTHER): Payer: Managed Care, Other (non HMO) | Admitting: General Surgery

## 2015-10-27 ENCOUNTER — Encounter: Payer: Self-pay | Admitting: General Surgery

## 2015-10-27 VITALS — BP 128/74 | HR 78 | Resp 12 | Ht 66.0 in | Wt 198.0 lb

## 2015-10-27 DIAGNOSIS — K4020 Bilateral inguinal hernia, without obstruction or gangrene, not specified as recurrent: Secondary | ICD-10-CM

## 2015-10-27 NOTE — Progress Notes (Signed)
This is a 60 year old male here today for his post op laparoscopic bilateral inguinal hernia repair done on . Patient states he is doing well .  I have reviewed the history of present illness with the patient.  Inguinal hernia repair is intact and portsites healing well. Abdomen is soft and lungs are clear.  Patient may return back to work on 11/07/15. Proper lifting techniques reviewed.  Patient to return in one month.       PCP:  Caryn Section,  This information has been scribed by Gaspar Cola CMA.

## 2015-10-27 NOTE — Patient Instructions (Addendum)
Patient to return in one month.Proper lifting techniques reviewed. Resume some activities in one more week. Return back to work on 11/07/15.

## 2015-10-28 ENCOUNTER — Encounter: Payer: Self-pay | Admitting: General Surgery

## 2015-11-11 ENCOUNTER — Encounter: Payer: Self-pay | Admitting: General Surgery

## 2015-11-28 ENCOUNTER — Encounter: Payer: Self-pay | Admitting: General Surgery

## 2015-11-29 ENCOUNTER — Ambulatory Visit (INDEPENDENT_AMBULATORY_CARE_PROVIDER_SITE_OTHER): Payer: Managed Care, Other (non HMO) | Admitting: General Surgery

## 2015-11-29 VITALS — BP 132/76 | HR 73 | Resp 12 | Ht 62.0 in | Wt 197.0 lb

## 2015-11-29 DIAGNOSIS — K4020 Bilateral inguinal hernia, without obstruction or gangrene, not specified as recurrent: Secondary | ICD-10-CM

## 2015-11-29 NOTE — Progress Notes (Signed)
Mr. Derek Jimenez is a 60 year old male here today for his one month post op laparoscopic bilateral inguinal hernia repair. Patient states he is doing well .  I have reviewed the history of present illness with the patient.  Inguinal hernia repair is intact and portsites are well healed. Abdomen is soft, nontender and lungs are clear Patient to return as needed.  PCP:  Birdie Sons  This information has been scribed by Gaspar Cola CMA.

## 2015-11-29 NOTE — Patient Instructions (Signed)
Patient to return as needed. 

## 2015-11-30 ENCOUNTER — Encounter: Payer: Self-pay | Admitting: General Surgery

## 2016-02-23 ENCOUNTER — Encounter: Payer: Self-pay | Admitting: General Surgery

## 2016-03-07 ENCOUNTER — Other Ambulatory Visit: Payer: Self-pay | Admitting: Family Medicine

## 2016-03-27 ENCOUNTER — Telehealth: Payer: Self-pay | Admitting: Family Medicine

## 2016-03-27 DIAGNOSIS — E559 Vitamin D deficiency, unspecified: Secondary | ICD-10-CM

## 2016-03-27 DIAGNOSIS — E782 Mixed hyperlipidemia: Secondary | ICD-10-CM

## 2016-03-27 NOTE — Telephone Encounter (Signed)
Please advise 

## 2016-03-27 NOTE — Telephone Encounter (Signed)
Pt wants to pick up a lab order to get his labs rechecked.  He will make an appt after he gets the labs done.  His call back is [phone]  Thanks teri

## 2016-04-10 ENCOUNTER — Ambulatory Visit: Payer: Managed Care, Other (non HMO) | Admitting: Family Medicine

## 2016-04-26 LAB — HEPATIC FUNCTION PANEL
ALT: 22 IU/L (ref 0–44)
AST: 21 IU/L (ref 0–40)
Albumin: 4.8 g/dL (ref 3.6–4.8)
Alkaline Phosphatase: 65 IU/L (ref 39–117)
Bilirubin Total: 0.6 mg/dL (ref 0.0–1.2)
Bilirubin, Direct: 0.15 mg/dL (ref 0.00–0.40)
Total Protein: 7.4 g/dL (ref 6.0–8.5)

## 2016-04-26 LAB — VITAMIN D 25 HYDROXY (VIT D DEFICIENCY, FRACTURES): Vit D, 25-Hydroxy: 28 ng/mL — ABNORMAL LOW (ref 30.0–100.0)

## 2016-04-26 LAB — LIPID PANEL
Chol/HDL Ratio: 5.6 ratio units — ABNORMAL HIGH (ref 0.0–5.0)
Cholesterol, Total: 219 mg/dL — ABNORMAL HIGH (ref 100–199)
HDL: 39 mg/dL — AB (ref >39)
LDL Calculated: 131 mg/dL — ABNORMAL HIGH (ref 0–99)
Triglycerides: 246 mg/dL — ABNORMAL HIGH (ref 0–149)
VLDL Cholesterol Cal: 49 mg/dL — ABNORMAL HIGH (ref 5–40)

## 2016-05-08 ENCOUNTER — Ambulatory Visit (INDEPENDENT_AMBULATORY_CARE_PROVIDER_SITE_OTHER): Payer: Managed Care, Other (non HMO) | Admitting: Family Medicine

## 2016-05-08 ENCOUNTER — Telehealth: Payer: Self-pay

## 2016-05-08 ENCOUNTER — Encounter: Payer: Self-pay | Admitting: Family Medicine

## 2016-05-08 VITALS — BP 126/84 | HR 86 | Temp 98.3°F | Resp 14 | Wt 196.0 lb

## 2016-05-08 DIAGNOSIS — K649 Unspecified hemorrhoids: Secondary | ICD-10-CM

## 2016-05-08 DIAGNOSIS — E782 Mixed hyperlipidemia: Secondary | ICD-10-CM | POA: Diagnosis not present

## 2016-05-08 DIAGNOSIS — E559 Vitamin D deficiency, unspecified: Secondary | ICD-10-CM | POA: Diagnosis not present

## 2016-05-08 MED ORDER — HYDROCORTISONE ACE-PRAMOXINE 1-1 % RE CREA: 1.0000 "application " | TOPICAL_CREAM | Freq: Two times a day (BID) | RECTAL | 5 refills | Status: DC

## 2016-05-08 NOTE — Telephone Encounter (Signed)
Per [facility] pramoxine-hydrocortisone not covered by insurance. Please review. Thank you. sd

## 2016-05-08 NOTE — Patient Instructions (Signed)
No visits with results within 30 Day(s) from this visit.  Latest known visit with results is:  Telephone on 03/27/2016  Component Date Value Ref Range Status  . Cholesterol, Total 04/26/2016 219* 100 - 199 mg/dL Final  . Triglycerides 04/26/2016 246* 0 - 149 mg/dL Final  . HDL 04/26/2016 39* >39 mg/dL Final  . VLDL Cholesterol Cal 04/26/2016 49* 5 - 40 mg/dL Final  . LDL Calculated 04/26/2016 131* 0 - 99 mg/dL Final  . Chol/HDL Ratio 04/26/2016 5.6* 0.0 - 5.0 ratio units Final   Comment:                                   T. Chol/HDL Ratio                                             Men  Women                               1/2 Avg.Risk  3.4    3.3                                   Avg.Risk  5.0    4.4                                2X Avg.Risk  9.6    7.1                                3X Avg.Risk 23.4   11.0   . Total Protein 04/26/2016 7.4  6.0 - 8.5 g/dL Final  . Albumin 04/26/2016 4.8  3.6 - 4.8 g/dL Final  . Bilirubin Total 04/26/2016 0.6  0.0 - 1.2 mg/dL Final  . Bilirubin, Direct 04/26/2016 0.15  0.00 - 0.40 mg/dL Final  . Alkaline Phosphatase 04/26/2016 65  39 - 117 IU/L Final  . AST 04/26/2016 21  0 - 40 IU/L Final  . ALT 04/26/2016 22  0 - 44 IU/L Final  . Vit D, 25-Hydroxy 04/26/2016 28.0* 30.0 - 100.0 ng/mL Final   Comment: Vitamin D deficiency has been defined by the [place] practice guideline as a level of serum 25-OH vitamin D less than 20 ng/mL (1,2). The Endocrine Society went on to further define vitamin D insufficiency as a level between 21 and 29 ng/mL (2). 1. IOM (Institute of Medicine). 2010. Dietary reference    intakes for calcium and D. [place]: The    [employer]. 2. Holick MF, Binkley [place], Bischoff-Ferrari HA, et al.    Evaluation, treatment, and prevention of vitamin D    deficiency: an Endocrine Society clinical practice    guideline. JCEM. 2011 Jul; 96(7):1911-30.

## 2016-05-08 NOTE — Progress Notes (Signed)
Patient: Derek Jimenez Male    DOB: [birth year]   60 y.o.   MRN: [id number] Visit Date: 05/08/2016  Today's Provider: Lelon Huh, MD   Chief Complaint  Patient presents with  . Hyperlipidemia  . Follow-up    Vitamin D deficiency   Subjective:    HPI  Lipid/Cholesterol, Follow-up:   Last seen for this 3 months ago.  Management changes since that visit include none. . Last Lipid Panel:    Component Value Date/Time   CHOL 219 (H) 04/25/2016 1550   TRIG 246 (H) 04/25/2016 1550   HDL 39 (L) 04/25/2016 1550   CHOLHDL 5.6 (H) 04/25/2016 1550   LDLCALC 131 (H) 04/25/2016 1550    Risk factors for vascular disease include hypercholesterolemia and smoking  He reports good compliance with treatment. He is not having side effects.  Current symptoms include none and have been unchanged. Weight trend: stable Current exercise: none currently but up until about 2-3 weeks ago he was walking about 3 miles per day.  Wt Readings from Last 3 Encounters:  05/08/16 196 lb (88.9 kg)  11/29/15 197 lb (89.4 kg)  10/27/15 198 lb (89.8 kg)    ------------------------------------------------------------------- Vitamin D deficiency- pt is here for a 3 month follow up. He reports that he is taking his vitamin D daily and feeling well.  Lab Results  Component Value Date   VD25OH 28.0 (L) 04/25/2016    Hemorrhoids Patient states he has been having hemorrhoids more frequently lately and requests prescription as OTC medication are often not effective.      Allergies  Allergen Reactions  . Meclizine Other (See Comments)     Current Outpatient Prescriptions:  .  aspirin 81 MG tablet, Take 1 tablet by mouth daily., Disp: , Rfl:  .  atorvastatin (LIPITOR) 40 MG tablet, TAKE ONE TABLET BY MOUTH ONCE DAILY IN THE EVENING, Disp: 90 tablet, Rfl: 0 .  Cholecalciferol (VITAMIN D) 2000 UNITS tablet, Take 1 tablet by mouth daily., Disp: , Rfl:   Review of Systems  Constitutional:  Negative.   HENT: Negative.   Eyes: Negative.   Respiratory: Negative.   Cardiovascular: Negative.   Gastrointestinal: Negative.   Endocrine: Negative.   Genitourinary: Negative.   Musculoskeletal: Negative.   Skin: Negative.   Allergic/Immunologic: Negative.   Neurological: Positive for dizziness (occasional vertigo).  Hematological: Negative.   Psychiatric/Behavioral: Negative.     Social History  Substance Use Topics  . Smoking status: Former Smoker    Quit date: 10/13/1976  . Smokeless tobacco: Never Used  . Alcohol use No   Objective:   BP 126/84 (BP Location: Right Arm, Patient Position: Sitting, Cuff Size: Normal)   Pulse 86   Temp 98.3 F (36.8 C) (Oral)   Resp 14   Wt 196 lb (88.9 kg)   SpO2 97%   BMI 35.85 kg/m   Physical Exam   General Appearance:    Alert, cooperative, no distress  Eyes:    PERRL, conjunctiva/corneas clear, EOM's intact       Lungs:     Clear to auscultation bilaterally, respirations unlabored  Heart:    Regular rate and rhythm  Neurologic:   Awake, alert, oriented x 3. No apparent focal neurological           defect.           Assessment & Plan:     1. Hemorrhoids, unspecified hemorrhoid type  - pramoxine-hydrocortisone (PROCTOCREAM-HC) 1-1 % rectal cream;  Place 1 application rectally 2 (two) times daily.  Dispense: 30 g; Refill: 5  2. Mixed hyperlipidemi Patient is compliant with atorvasttin, but has not been exercising and is not heating as healthy has he had been. He is going to work on diet ane exercise and will recheck lipids in 6 months.     3. Vitamin D deficiency Stable The current medical regimen is effective;  continue present plan and medications. Current vitamin d supplementation.        Lelon Huh, MD  [facility]

## 2016-05-09 NOTE — Telephone Encounter (Signed)
Co pay for 1-2.5% cream is over $90. Pharmacist recommended to change to just hydrocortisone 2%. Please review. Thank you. sd

## 2016-05-11 MED ORDER — HYDROCORTISONE 2.5 % RE CREA: 1.0000 "application " | TOPICAL_CREAM | Freq: Two times a day (BID) | RECTAL | 0 refills | Status: DC | PRN

## 2016-05-11 NOTE — Addendum Note (Signed)
Addended by: Lelon Huh E on: 05/11/2016 11:18 AM   Modules accepted: Orders

## 2016-06-20 ENCOUNTER — Other Ambulatory Visit: Payer: Self-pay | Admitting: Family Medicine

## 2016-06-21 ENCOUNTER — Other Ambulatory Visit: Payer: Self-pay | Admitting: Family Medicine

## 2016-06-21 MED ORDER — ATORVASTATIN CALCIUM 40 MG PO TABS: 40.0000 mg | ORAL_TABLET | Freq: Every day | ORAL | 4 refills | Status: DC

## 2016-06-21 NOTE — Telephone Encounter (Signed)
LOV 05/08/2016. Derek Jimenez, CMA

## 2016-06-21 NOTE — Telephone Encounter (Signed)
Pt needs refill on his   atorvastatin (LIPITOR) 40 MG tablet   06/20/16 -- Birdie Sons, MD    Take 1 tablet (40 mg total) by mouth daily

## 2016-07-02 ENCOUNTER — Encounter: Payer: Self-pay | Admitting: General Surgery

## 2016-07-06 ENCOUNTER — Encounter: Payer: Self-pay | Admitting: General Surgery

## 2016-10-09 ENCOUNTER — Other Ambulatory Visit: Payer: Self-pay | Admitting: Family Medicine

## 2016-10-09 MED ORDER — ATORVASTATIN CALCIUM 40 MG PO TABS: 40.0000 mg | ORAL_TABLET | Freq: Every day | ORAL | 4 refills | Status: DC

## 2016-10-09 NOTE — Telephone Encounter (Signed)
[facility] [place] faxed Rx refill request for the following medications: atorvastatin (LIPITOR) 40 MG tablet  The above pharmacy wasn't in pt's preferred pharmacy and pt didn't answer when I tried to verify if pt wanted Rx sent to the mail order pharmacy. Please advise. Thanks TNP

## 2016-10-09 NOTE — Telephone Encounter (Signed)
Pt called stated that he does want to start using [facility] and request an Rx for atorvastatin (LIPITOR) 40 MG tablet [facility] day supply [facility]. Please advise. Thanks TNP

## 2017-08-09 ENCOUNTER — Encounter: Payer: Self-pay | Admitting: *Deleted

## 2017-08-09 ENCOUNTER — Other Ambulatory Visit: Payer: Self-pay

## 2017-08-09 ENCOUNTER — Ambulatory Visit
Admission: EM | Admit: 2017-08-09 | Discharge: 2017-08-09 | Disposition: A | Discharge status: Home / Self Care | Payer: Commercial Managed Care - PPO | Attending: Emergency Medicine | Admitting: Emergency Medicine

## 2017-08-09 DIAGNOSIS — J029 Acute pharyngitis, unspecified: Secondary | ICD-10-CM

## 2017-08-09 LAB — RAPID STREP SCREEN (MED CTR MEBANE ONLY): Streptococcus, Group A Screen (Direct): NEGATIVE

## 2017-08-09 LAB — RAPID INFLUENZA A&B ANTIGENS (ARMC ONLY): INFLUENZA B (ARMC): NEGATIVE

## 2017-08-09 LAB — RAPID INFLUENZA A&B ANTIGENS: Influenza A (ARMC): NEGATIVE

## 2017-08-09 MED ORDER — FLUTICASONE PROPIONATE 50 MCG/ACT NA SUSP: 2.0000 | Freq: Every day | NASAL | 0 refills | Status: DC

## 2017-08-09 MED ORDER — IBUPROFEN 600 MG PO TABS: 600.0000 mg | ORAL_TABLET | Freq: Four times a day (QID) | ORAL | 0 refills | Status: DC | PRN

## 2017-08-09 MED ORDER — HYDROCOD POLST-CPM POLST ER 10-8 MG/5ML PO SUER: 5.0000 mL | Freq: Two times a day (BID) | ORAL | 0 refills | Status: DC | PRN

## 2017-08-09 NOTE — Discharge Instructions (Addendum)
your rapid strep was negative today, so we have sent off a throat culture.  We will contact you and call in the appropriate antibiotics if your culture comes back positive for an infection requiring antibiotic treatment.  Give [place] a working phone number.  If you were given a prescription for antibiotics, you may want to wait and fill it until you know the results of the culture.  1 gram of Tylenol and 600 mg ibuprofen together 3-4 times a day as needed for pain.  Make sure you drink plenty of extra fluids.  Some people find salt water gargles and  Traditional Medicinal's "Throat Coat" tea helpful. Take 5 mL of liquid Benadryl and 5 mL of Maalox. Mix it together, and then hold it in your mouth for as long as you can and then swallow. You may do this 4 times a day.    Go to www.goodrx.com to look up your medications. This will give you a list of where you can find your prescriptions at the most affordable prices. Or ask the pharmacist what the cash price is, or if they have any other discount programs available to help make your medication more affordable. This can be less expensive than what you would pay with insurance.

## 2017-08-09 NOTE — ED Triage Notes (Signed)
Patient started having symptoms of severe sore throat, fever, and fatigue 2 days ago.

## 2017-08-09 NOTE — ED Provider Notes (Signed)
HPI  SUBJECTIVE:  Patient reports burning sore throat starting 3 days ago. Sx worse with swallowing.  Sx better with drinking cold fluids and sucking on ice chips. Has been taking Mucinex, an unknown over-the-counter cold and flu medicine w/ o relief.  No documented fevers, patient states that he does not have a thermometer at home.  He said he has felt feverish.   No neck stiffness  + Cough productive of white phlegm/URI sxs of nasal congestion, postnasal drip, chest congestion States That he is unable to sleep at night due to the cough + Myalgias + Headache No Rash     No Recent Strep Exposure No Abdominal Pain No reflux sxs No Allergy sxs  No Breathing difficulty, voice changes, sensation of throat swelling shut No Drooling No Trismus No abx in past month.  No antipyretic in past 6-8 hours. No history of mono, recurrent strep, diabetes, hypertension, GERD, allergies, asthma, eczema, COPD, immunocompromise. DPO:EUMPNT, Kirstie Peri, MD   Past Medical History:  Diagnosis Date  . Allergy   . Hyperlipidemia     Past Surgical History:  Procedure Laterality Date  . COLONOSCOPY  2016  . HERNIA REPAIR  2006  . INGUINAL HERNIA REPAIR Bilateral 10/21/2015   Procedure: Laparoscopic bilateral inguinal hernia repair;  Surgeon: Christene Lye, MD;  Location: [facility] ORS;  Service: General;  Laterality: Bilateral;    Family History  Problem Relation Age of Onset  . Diabetes Mother        boderline    Social History   Tobacco Use  . Smoking status: Former Smoker    Last attempt to quit: 10/13/1976    Years since quitting: 40.8  . Smokeless tobacco: Never Used  Substance Use Topics  . Alcohol use: No  . Drug use: No    No current facility-administered medications for this encounter.   Current Outpatient Medications:  .  aspirin 81 MG tablet, Take 1 tablet by mouth daily., Disp: , Rfl:  .  atorvastatin (LIPITOR) 40 MG tablet, Take 1 tablet (40 mg total) by mouth daily.,  Disp: 90 tablet, Rfl: 4 .  Cholecalciferol (VITAMIN D) 2000 UNITS tablet, Take 1 tablet by mouth daily., Disp: , Rfl:  .  chlorpheniramine-HYDROcodone (TUSSIONEX PENNKINETIC ER) 10-8 MG/5ML SUER, Take 5 mLs by mouth every 12 (twelve) hours as needed for cough., Disp: 120 mL, Rfl: 0 .  fluticasone (FLONASE) 50 MCG/ACT nasal spray, Place 2 sprays into both nostrils daily., Disp: 16 g, Rfl: 0 .  ibuprofen (ADVIL,MOTRIN) 600 MG tablet, Take 1 tablet (600 mg total) by mouth every 6 (six) hours as needed., Disp: 30 tablet, Rfl: 0  Allergies  Allergen Reactions  . Meclizine Other (See Comments)     ROS  As noted in HPI.   Physical Exam  BP 140/89 (BP Location: Left Arm)   Pulse 84   Temp 99.3 F (37.4 C) (Oral)   Resp 16   Ht 5\' 8"  (1.727 m)   Wt 192 lb (87.1 kg)   SpO2 95%   BMI 29.19 kg/m   Constitutional: Well developed, well nourished, no acute distress Eyes:  EOMI, conjunctiva normal bilaterally HENT: Normocephalic, atraumatic,mucus membranes moist. +  nasal congestion  - erythematous oropharynx - enlarged tonsils  - exudates. Uvula midline.  No obvious postnasal drip. Respiratory: Normal inspiratory effort, good air movement, lungs clear bilaterally Cardiovascular: Normal rate, no murmurs, rubs, gallops GI: nondistended, nontender. No appreciable splenomegaly skin: No rash, skin intact Lymph: - cervical LN  Musculoskeletal: no  deformities Neurologic: Alert & oriented x 3, no focal neuro deficits Psychiatric: Speech and behavior appropriate.   ED Course   Medications - No data to display  Orders Placed This Encounter  Procedures  . Rapid strep screen    Standing Status:   Standing    Number of Occurrences:   1  . Culture, group A strep    Standing Status:   Standing    Number of Occurrences:   1  . Rapid Influenza A&B Antigens ([facility] only)    Standing Status:   Standing    Number of Occurrences:   1  . Droplet precaution    Standing Status:   Standing    Number  of Occurrences:   1    Results for orders placed or performed during the hospital encounter of 08/09/17 (from the past 24 hour(s))  Rapid strep screen     Status: None   Collection Time: 08/09/17  5:35 PM  Result Value Ref Range   Streptococcus, Group A Screen (Direct) NEGATIVE NEGATIVE  Rapid Influenza A&B Antigens ([facility] only)     Status: None   Collection Time: 08/09/17  6:50 PM  Result Value Ref Range   Influenza A ([facility]) NEGATIVE NEGATIVE   Influenza B ([facility]) NEGATIVE NEGATIVE   No results found.  ED Clinical Impression  Acute pharyngitis, unspecified etiology   ED Assessment/Plan   Rapid strep and flu negative.  Presentation consistent with a viral pharyngitis/URI.  Obtaining throat culture to guide antibiotic treatment. Discussed this with patient. We'll contact him if culture is positive, and will call in Appropriate antibiotics. Patient home with ibuprofen, Tylenol, Benadryl/Maalox mixture, Tussionex, Flonase. Patient to followup with PMD when necessary.  [facility] narcotic database reviewed.  One short prescription of Percocet written in June 2017.  Discussed labs,  MDM, plan and followup with patient.  patient agrees with plan.   Meds ordered this encounter  Medications  . fluticasone (FLONASE) 50 MCG/ACT nasal spray    Sig: Place 2 sprays into both nostrils daily.    Dispense:  16 g    Refill:  0  . ibuprofen (ADVIL,MOTRIN) 600 MG tablet    Sig: Take 1 tablet (600 mg total) by mouth every 6 (six) hours as needed.    Dispense:  30 tablet    Refill:  0  . chlorpheniramine-HYDROcodone (TUSSIONEX PENNKINETIC ER) 10-8 MG/5ML SUER    Sig: Take 5 mLs by mouth every 12 (twelve) hours as needed for cough.    Dispense:  120 mL    Refill:  0    *This clinic note was created using [employer]. Therefore, there may be occasional mistakes despite careful proofreading.    Melynda Ripple, MD 08/09/17 [place]

## 2017-08-13 LAB — CULTURE, GROUP A STREP (THRC)

## 2017-10-08 ENCOUNTER — Other Ambulatory Visit: Payer: Self-pay | Admitting: Family Medicine

## 2017-10-08 DIAGNOSIS — E559 Vitamin D deficiency, unspecified: Secondary | ICD-10-CM

## 2017-10-08 DIAGNOSIS — E782 Mixed hyperlipidemia: Secondary | ICD-10-CM

## 2017-10-08 NOTE — Telephone Encounter (Signed)
Not seen since 2017, please advise needs to schedule office visit before refills can be approved.

## 2017-10-18 NOTE — Telephone Encounter (Signed)
Patient advised. Appointment has been scheduled for Wednesday, 10/23/2017 at 4:20pm. Patient was advised that he would most likely need lab work, and the lab would be closing at 4:30pm. Since he wanted a late appointment, he wouldn't be able to get them done on the same day as his appointment. Patient says he plans to arrive about an hour early for his appointment and would like to  get labs done before the office visit. He states he will be fasting and wants a lab slip to be ready so that he can get this all done in one day .

## 2017-10-18 NOTE — Telephone Encounter (Signed)
Ok, please print pended order and leave at lab for patient

## 2017-10-21 NOTE — Telephone Encounter (Signed)
Left detailed message on pt's vm

## 2017-10-23 ENCOUNTER — Encounter: Payer: Self-pay | Admitting: Family Medicine

## 2017-10-23 ENCOUNTER — Ambulatory Visit (INDEPENDENT_AMBULATORY_CARE_PROVIDER_SITE_OTHER): Payer: Commercial Managed Care - PPO | Admitting: Family Medicine

## 2017-10-23 VITALS — BP 122/72 | HR 71 | Temp 97.9°F | Resp 16 | Wt 191.0 lb

## 2017-10-23 DIAGNOSIS — E782 Mixed hyperlipidemia: Secondary | ICD-10-CM | POA: Diagnosis not present

## 2017-10-23 DIAGNOSIS — E559 Vitamin D deficiency, unspecified: Secondary | ICD-10-CM

## 2017-10-23 DIAGNOSIS — Z1211 Encounter for screening for malignant neoplasm of colon: Secondary | ICD-10-CM

## 2017-10-23 NOTE — Progress Notes (Signed)
Patient: Derek Jimenez Male    DOB: [birth year]   62 y.o.   MRN: [id number] Visit Date: 10/23/2017  Today's Provider: Lelon Huh, MD   Chief Complaint  Patient presents with  . Hyperlipidemia   Subjective:    HPI   Lipid/Cholesterol, Follow-up:   Last seen for this 05/08/2016.  Management since that visit includes; labs checked, advised to work on diet and exercise.  Last Lipid Panel:    Component Value Date/Time   CHOL 219 (H) 04/25/2016 1550   TRIG 246 (H) 04/25/2016 1550   HDL 39 (L) 04/25/2016 1550   CHOLHDL 5.6 (H) 04/25/2016 1550   LDLCALC 131 (H) 04/25/2016 1550    He reports good compliance with treatment. He is not having side effects.   Wt Readings from Last 3 Encounters:  10/23/17 191 lb (86.6 kg)  08/09/17 192 lb (87.1 kg)  05/08/16 196 lb (88.9 kg)    ------------------------------------------------------------------------  Vitamin D deficiency From 05/08/2016-labs checked., no changes. Stable. Patient reports good compliance with treatment and good tolerance.       Allergies  Allergen Reactions  . Meclizine Other (See Comments)     Current Outpatient Medications:  .  aspirin 81 MG tablet, Take 1 tablet by mouth daily., Disp: , Rfl:  .  atorvastatin (LIPITOR) 40 MG tablet, Take 1 tablet (40 mg total) by mouth daily., Disp: 90 tablet, Rfl: 4 .  Cholecalciferol (VITAMIN D) 2000 UNITS tablet, Take 1 tablet by mouth daily., Disp: , Rfl:  .  ibuprofen (ADVIL,MOTRIN) 200 MG tablet, Take 200 mg by mouth every 6 (six) hours as needed., Disp: , Rfl:   Review of Systems  Constitutional: Negative for appetite change, chills and fever.  Respiratory: Negative for chest tightness, shortness of breath and wheezing.   Cardiovascular: Negative for chest pain and palpitations.  Gastrointestinal: Negative for abdominal pain, nausea and vomiting.    Social History   Tobacco Use  . Smoking status: Former Smoker    Last attempt to quit:  10/13/1976    Years since quitting: 41.0  . Smokeless tobacco: Never Used  Substance Use Topics  . Alcohol use: No   Objective:   BP 122/72 (BP Location: Right Arm, Patient Position: Sitting, Cuff Size: Large)   Pulse 71   Temp 97.9 F (36.6 C) (Oral)   Resp 16   Wt 191 lb (86.6 kg)   SpO2 99% Comment: room air  BMI 29.04 kg/m  Vitals:   10/23/17 1615  BP: 122/72  Pulse: 71  Resp: 16  Temp: 97.9 F (36.6 C)  TempSrc: Oral  SpO2: 99%  Weight: 191 lb (86.6 kg)     Physical Exam   General Appearance:    Alert, cooperative, no distress  Eyes:    PERRL, conjunctiva/corneas clear, EOM's intact       Lungs:     Clear to auscultation bilaterally, respirations unlabored  Heart:    Regular rate and rhythm  Neurologic:   Awake, alert, oriented x 3. No apparent focal neurological           defect.       Audit-C Alcohol Use Screening   Alcohol Use Disorder Test (AUDIT) 10/23/2017  1. How often do you have a drink containing alcohol? 0  2. How many drinks containing alcohol do you have on a typical day when you are drinking? 0  3. How often do you have six or more drinks on one occasion? 0  AUDIT-C Score 0    A score of 3 or more in women, and 4 or more in men indicates increased risk for alcohol abuse, EXCEPT if all of the points are from question 1      Assessment & Plan:     1. Mixed hyperlipidemia He is tolerating atorvastatin well with no adverse effects.  Labs pending  2. Vitamin D deficiency Complaint with vitamin d supplement. Labs pending.   3. Screening for colon cancer Has h/o polyps followed by Dr. Tiffany Kocher. Advised he is due in october and to expect to be notified soon by [facility].   He declined recommended vaccines today.  Patient Instructions   The CDC recommends two doses of Shingrix (the shingles vaccine) separated by 2 to 6 months for adults age 61 years and older. I recommend checking with your insurance plan regarding coverage for this vaccine.    I  recommend that you get the tetanus vaccin (Td) . Please call our office at [phone] at your earliest convenience to schedule this vaccine.            Lelon Huh, MD  [facility]

## 2017-10-23 NOTE — Patient Instructions (Signed)
   The CDC recommends two doses of Shingrix (the shingles vaccine) separated by 2 to 6 months for adults age 64 years and older. I recommend checking with your insurance plan regarding coverage for this vaccine.    I recommend that you get the tetanus vaccin (Td) . Please call our office at [phone] at your earliest convenience to schedule this vaccine.

## 2017-10-24 ENCOUNTER — Encounter: Payer: Self-pay | Admitting: Family Medicine

## 2017-10-24 ENCOUNTER — Other Ambulatory Visit: Payer: Self-pay | Admitting: *Deleted

## 2017-10-24 LAB — COMPREHENSIVE METABOLIC PANEL
ALT: 22 IU/L (ref 0–44)
AST: 22 IU/L (ref 0–40)
Albumin/Globulin Ratio: 2.1 (ref 1.2–2.2)
Albumin: 4.9 g/dL — ABNORMAL HIGH (ref 3.6–4.8)
Alkaline Phosphatase: 64 IU/L (ref 39–117)
BILIRUBIN TOTAL: 0.7 mg/dL (ref 0.0–1.2)
BUN/Creatinine Ratio: 16 (ref 10–24)
BUN: 15 mg/dL (ref 8–27)
CALCIUM: 9.5 mg/dL (ref 8.6–10.2)
CHLORIDE: 102 mmol/L (ref 96–106)
CO2: 22 mmol/L (ref 20–29)
CREATININE: 0.96 mg/dL (ref 0.76–1.27)
GFR calc non Af Amer: 83 mL/min/{1.73_m2} (ref >59)
GFR, EST AFRICAN AMERICAN: 96 mL/min/{1.73_m2} (ref >59)
GLUCOSE: 75 mg/dL (ref 65–99)
Globulin, Total: 2.3 g/dL (ref 1.5–4.5)
Potassium: 4.1 mmol/L (ref 3.5–5.2)
Sodium: 140 mmol/L (ref 134–144)
TOTAL PROTEIN: 7.2 g/dL (ref 6.0–8.5)

## 2017-10-24 LAB — LIPID PANEL
Chol/HDL Ratio: 6.3 ratio — ABNORMAL HIGH (ref 0.0–5.0)
Cholesterol, Total: 213 mg/dL — ABNORMAL HIGH (ref 100–199)
HDL: 34 mg/dL — AB (ref >39)
LDL CALC: 150 mg/dL — AB (ref 0–99)
Triglycerides: 146 mg/dL (ref 0–149)
VLDL CHOLESTEROL CAL: 29 mg/dL (ref 5–40)

## 2017-10-24 LAB — VITAMIN D 25 HYDROXY (VIT D DEFICIENCY, FRACTURES): Vit D, 25-Hydroxy: 45.3 ng/mL (ref 30.0–100.0)

## 2017-10-24 MED ORDER — ATORVASTATIN CALCIUM 40 MG PO TABS: 40.0000 mg | ORAL_TABLET | Freq: Every day | ORAL | 4 refills | Status: DC

## 2018-03-10 ENCOUNTER — Telehealth: Payer: Self-pay | Admitting: Family Medicine

## 2018-03-10 DIAGNOSIS — Z86010 Personal history of colonic polyps: Secondary | ICD-10-CM

## 2018-03-10 NOTE — Telephone Encounter (Signed)
Pt advised.  He has not heard from [facility] yet.  Will order the referral.    Thanks,   -Mickel Baas

## 2018-03-10 NOTE — Telephone Encounter (Signed)
Please check with patient to see if he has heard from [facility]. To schedule follow up colonoscopy. He is due for one this year. If not then need referral to Dr. Tiffany Kocher for colonoscopy due to history of colon adenoma.

## 2018-03-10 NOTE — Telephone Encounter (Signed)
LMTCB 03/10/2018  Thanks,   -Mickel Baas

## 2018-03-24 NOTE — Telephone Encounter (Signed)
Patient called back stating he is returning a call to Mickel Baas.

## 2018-03-24 NOTE — Telephone Encounter (Signed)
I did not call this pt recently.   Thanks,   -Mickel Baas

## 2018-05-22 DIAGNOSIS — K640 First degree hemorrhoids: Secondary | ICD-10-CM | POA: Diagnosis not present

## 2018-05-22 DIAGNOSIS — Z1211 Encounter for screening for malignant neoplasm of colon: Secondary | ICD-10-CM | POA: Diagnosis not present

## 2018-05-22 DIAGNOSIS — Z86010 Personal history of colonic polyps: Secondary | ICD-10-CM | POA: Diagnosis not present

## 2018-10-08 ENCOUNTER — Other Ambulatory Visit: Payer: Self-pay | Admitting: Family Medicine

## 2019-03-25 ENCOUNTER — Other Ambulatory Visit: Payer: Self-pay | Admitting: Family Medicine

## 2019-03-25 NOTE — Telephone Encounter (Signed)
Last OV and last labs 10/23/2017

## 2019-06-16 ENCOUNTER — Other Ambulatory Visit: Payer: Self-pay | Admitting: Family Medicine

## 2019-07-22 ENCOUNTER — Other Ambulatory Visit: Payer: Self-pay | Admitting: Family Medicine

## 2019-07-22 NOTE — Telephone Encounter (Signed)
LMTCB to schedule a follow up appointment. Okay for PEC to schedule.

## 2019-07-22 NOTE — Telephone Encounter (Signed)
Requested medication (s) are due for refill today: yes  Requested medication (s) are on the active medication list:yes  Last refill:  06/22/19  Future visit scheduled: no  Notes to clinic:  no valid encounter within last 12 months; 30 day refill previously granted; see telephone encounter dated 07/22/19    Requested Prescriptions  Pending Prescriptions Disp Refills   atorvastatin (LIPITOR) 40 MG tablet [Pharmacy Med Name: ATORVASTATIN TAB 40MG ] 30 tablet 0    Sig: TAKE 1 TABLET DAILY (NEEDS OFFICE VISIT FOR FURTHER   REFILLS)      Cardiovascular:  Antilipid - Statins Failed - 07/22/2019  9:20 AM      Failed - Total Cholesterol in normal range and within 360 days    Cholesterol, Total  Date Value Ref Range Status  10/23/2017 213 (H) 100 - 199 mg/dL Final          Failed - LDL in normal range and within 360 days    LDL Calculated  Date Value Ref Range Status  10/23/2017 150 (H) 0 - 99 mg/dL Final          Failed - HDL in normal range and within 360 days    HDL  Date Value Ref Range Status  10/23/2017 34 (L) >39 mg/dL Final          Failed - Triglycerides in normal range and within 360 days    Triglycerides  Date Value Ref Range Status  10/23/2017 146 0 - 149 mg/dL Final          Failed - Valid encounter within last 12 months    Recent Outpatient Visits           1 year ago Mixed hyperlipidemia   [facility] Birdie Sons, MD   3 years ago Mixed hyperlipidemia   [facility] Birdie Sons, MD   3 years ago Left inguinal pain   [facility] Birdie Sons, MD   4 years ago Mixed hyperlipidemia   [place], Kirstie Peri, MD              Passed - Patient is not pregnant

## 2019-07-22 NOTE — Telephone Encounter (Signed)
Requested medications are due for refill today?  Yes  Requested medications are on active medication list?   Yes  Last Refill:   06/16/2019   # 30 with 0 refills - This was courtesy refill.    Future visit scheduled?  NO   Notes to Clinic:  Courtesy refill provided on 06/16/2019.  Patient needs office visit.

## 2019-07-22 NOTE — Telephone Encounter (Signed)
Attempted to contact pt due to refill request; left message on voicemail; no valid encounter noted within the last 12 months; no upcoming appts noted; 30 day courtesy refill previously granted; will route to office for final disposition. Requested medication (s) are due for refill today: Atorvastatin,  Requested medication (s) are on the active medication list: Atorvastatin, yes  Last refill:  06/22/19  Future visit scheduled: no  Notes to clinic:  no valid encounter within last 12 months; 30 day courtesy refill previously granted

## 2019-07-23 NOTE — Telephone Encounter (Signed)
LMTCB to schedule a follow up appointment  

## 2019-09-07 ENCOUNTER — Telehealth: Payer: Self-pay

## 2019-09-07 NOTE — Telephone Encounter (Signed)
Copied from [place] [id number]. Topic: General - Other >> Sep 07, 2019  3:54 PM Leward Quan A wrote: Reason for CRM: Patient called to inquire if Dr Caryn Section can please send him orders for blood work in the mail to his address. Patient states that he was bombarded by work and could not come into the office right now. Any questions he can be reached at Ph# [phone]

## 2019-09-07 NOTE — Telephone Encounter (Signed)
Last office visit was 10/2017. Patient is overdue for office visit. I called and spoke with patient. Follow up appt scheduled for 09/16/2019 at 8:40am. Patient advised to be fasting for labs.

## 2019-09-15 NOTE — Progress Notes (Signed)
Established patient visit   Patient: Derek Jimenez   DOB: 10-Sep-1955   64 y.o. Male  MRN: [id number] Visit Date: 09/16/2019  Today's healthcare provider: Lelon Huh, MD   Chief Complaint  Patient presents with  . Hyperlipidemia   Subjective    HPI  Lipid/Cholesterol, Follow-up  Last lipid panel Other pertinent labs  Lab Results  Component Value Date   CHOL 213 (H) 10/23/2017   HDL 34 (L) 10/23/2017   LDLCALC 150 (H) 10/23/2017   TRIG 146 10/23/2017   CHOLHDL 6.3 (H) 10/23/2017   Lab Results  Component Value Date   ALT 22 10/23/2017   AST 22 10/23/2017   TSH 2.780 02/01/2015     He was last seen for this on 10/23/2017 He reports good compliance with treatment. He is not having side effects.  Symptoms: No chest pain No chest pressure/discomfort No dyspnea No lower extremity edema No numbness or tingling of extremity No orthopnea No palpitations No paroxysmal nocturnal dyspnea No speech difficulty No syncope  Current diet: well balanced Current exercise: walking  Wt Readings from Last 3 Encounters:  09/16/19 189 lb (85.7 kg)  10/23/17 191 lb (86.6 kg)  08/09/17 192 lb (87.1 kg)   The 10-year ASCVD risk score Mikey Bussing DC Jr., et al., 2013) is: 14.5%  ----------------------------------------------------------------------------------------- Vitamin D deficiency, follow-up  Lab Results  Component Value Date   VD25OH 45.3 10/23/2017   VD25OH 28.0 (L) 04/25/2016   VD25OH 26.9 (L) 02/01/2015   CALCIUM 9.5 10/23/2017   Wt Readings from Last 3 Encounters:  09/16/19 189 lb (85.7 kg)  10/23/17 191 lb (86.6 kg)  08/09/17 192 lb (87.1 kg)    He was last seen for vitamin D deficiency 10/23/2017  He reports good compliance with treatment. He is not having side effects.   Symptoms: No change in energy level No numbness or tingling No bone pain No unexplained  fracture  ---------------------------------------------------------------------------------------------------      Medications: Outpatient Medications Prior to Visit  Medication Sig  . aspirin 81 MG tablet Take 1 tablet by mouth daily.  Marland Kitchen atorvastatin (LIPITOR) 40 MG tablet TAKE 1 TABLET DAILY  . Cholecalciferol (VITAMIN D) 2000 UNITS tablet Take 1 tablet by mouth in the morning and at bedtime.   . [DISCONTINUED] ibuprofen (ADVIL,MOTRIN) 200 MG tablet Take 200 mg by mouth every 6 (six) hours as needed.   No facility-administered medications prior to visit.    Review of Systems  Constitutional: Negative for appetite change, chills and fever.  Respiratory: Negative for chest tightness, shortness of breath and wheezing.   Cardiovascular: Negative for chest pain and palpitations.  Gastrointestinal: Negative for abdominal pain, nausea and vomiting.      Objective    BP 114/74 (BP Location: Right Arm, Patient Position: Sitting, Cuff Size: Large)   Pulse 84   Temp 97.7 F (36.5 C) (Temporal)   Resp 16   Ht 5' 7.5" (1.715 m)   Wt 189 lb (85.7 kg)   SpO2 96% Comment: room air  BMI 29.16 kg/m    Physical Exam   General: Appearance:     Overweight male in no acute distress  Eyes:    PERRL, conjunctiva/corneas clear, EOM's intact       Lungs:     Clear to auscultation bilaterally, respirations unlabored  Heart:    Normal heart rate. Normal rhythm. No murmurs, rubs, or gallops.   MS:   All extremities are intact.   Neurologic:   Awake, alert,  oriented x 3. No apparent focal neurological           defect.        No results found for any visits on 09/16/19.  Assessment & Plan     1. Mixed hyperlipidemia He is tolerating atorvastatin well with no adverse effects.   - Comprehensive metabolic panel - Lipid panel - CBC  2. Vitamin D deficiency  - VITAMIN D 25 Hydroxy (Vit-D Deficiency, Fractures)  3. Need for hepatitis C screening test  - Hepatitis C antibody  4.  Prostate cancer screening  - PSA Total (Reflex To Free) ([facility] only)   No follow-ups on file.      The entirety of the information documented in the History of Present Illness, Review of Systems and Physical Exam were personally obtained by me. Portions of this information were initially documented by the CMA and reviewed by me for thoroughness and accuracy.      Lelon Huh, MD  [facility] [phone] (phone) [phone] (fax)  [place]

## 2019-09-16 ENCOUNTER — Ambulatory Visit: Payer: Commercial Managed Care - PPO | Admitting: Family Medicine

## 2019-09-16 ENCOUNTER — Ambulatory Visit (INDEPENDENT_AMBULATORY_CARE_PROVIDER_SITE_OTHER): Payer: Commercial Managed Care - PPO | Admitting: Family Medicine

## 2019-09-16 ENCOUNTER — Encounter: Payer: Self-pay | Admitting: Family Medicine

## 2019-09-16 ENCOUNTER — Other Ambulatory Visit: Payer: Self-pay

## 2019-09-16 VITALS — BP 114/74 | HR 84 | Temp 97.7°F | Resp 16 | Ht 67.5 in | Wt 189.0 lb

## 2019-09-16 DIAGNOSIS — E559 Vitamin D deficiency, unspecified: Secondary | ICD-10-CM | POA: Diagnosis not present

## 2019-09-16 DIAGNOSIS — E782 Mixed hyperlipidemia: Secondary | ICD-10-CM

## 2019-09-16 DIAGNOSIS — Z125 Encounter for screening for malignant neoplasm of prostate: Secondary | ICD-10-CM

## 2019-09-16 DIAGNOSIS — Z1159 Encounter for screening for other viral diseases: Secondary | ICD-10-CM

## 2019-09-17 ENCOUNTER — Telehealth: Payer: Self-pay

## 2019-09-17 LAB — COMPREHENSIVE METABOLIC PANEL
ALT: 17 IU/L (ref 0–44)
AST: 15 IU/L (ref 0–40)
Albumin/Globulin Ratio: 1.9 (ref 1.2–2.2)
Albumin: 5.1 g/dL — ABNORMAL HIGH (ref 3.8–4.8)
Alkaline Phosphatase: 94 IU/L (ref 39–117)
BUN/Creatinine Ratio: 11 (ref 10–24)
BUN: 12 mg/dL (ref 8–27)
Bilirubin Total: 0.5 mg/dL (ref 0.0–1.2)
CO2: 22 mmol/L (ref 20–29)
Calcium: 10 mg/dL (ref 8.6–10.2)
Chloride: 100 mmol/L (ref 96–106)
Creatinine, Ser: 1.07 mg/dL (ref 0.76–1.27)
GFR calc Af Amer: 83 mL/min/{1.73_m2} (ref >59)
GFR calc non Af Amer: 72 mL/min/{1.73_m2} (ref >59)
Globulin, Total: 2.7 g/dL (ref 1.5–4.5)
Glucose: 103 mg/dL — ABNORMAL HIGH (ref 65–99)
Potassium: 4.3 mmol/L (ref 3.5–5.2)
Sodium: 139 mmol/L (ref 134–144)
Total Protein: 7.8 g/dL (ref 6.0–8.5)

## 2019-09-17 LAB — LIPID PANEL
Chol/HDL Ratio: 5 ratio (ref 0.0–5.0)
Cholesterol, Total: 194 mg/dL (ref 100–199)
HDL: 39 mg/dL — ABNORMAL LOW (ref >39)
LDL Chol Calc (NIH): 124 mg/dL — ABNORMAL HIGH (ref 0–99)
Triglycerides: 172 mg/dL — ABNORMAL HIGH (ref 0–149)
VLDL Cholesterol Cal: 31 mg/dL (ref 5–40)

## 2019-09-17 LAB — CBC
Hematocrit: 50.8 % (ref 37.5–51.0)
Hemoglobin: 16.7 g/dL (ref 13.0–17.7)
MCH: 29.9 pg (ref 26.6–33.0)
MCHC: 32.9 g/dL (ref 31.5–35.7)
MCV: 91 fL (ref 79–97)
Platelets: 228 10*3/uL (ref 150–450)
RBC: 5.58 x10E6/uL (ref 4.14–5.80)
RDW: 13 % (ref 11.6–15.4)
WBC: 6.2 10*3/uL (ref 3.4–10.8)

## 2019-09-17 LAB — HEPATITIS C ANTIBODY: Hep C Virus Ab: <0.1 s/co ratio (ref 0.0–0.9)

## 2019-09-17 LAB — PSA TOTAL (REFLEX TO FREE): Prostate Specific Ag, Serum: 1.1 ng/mL (ref 0.0–4.0)

## 2019-09-17 LAB — VITAMIN D 25 HYDROXY (VIT D DEFICIENCY, FRACTURES): Vit D, 25-Hydroxy: 49.8 ng/mL (ref 30.0–100.0)

## 2019-09-17 NOTE — Telephone Encounter (Signed)
-----   Message from Birdie Sons, MD sent at 09/17/2019  9:51 AM EDT ----- Good morning,   Your test results are back and should be available to view in MyChart. Your cholesterol is good at 194. Your blood sugar, PSA kidney functions, and electrolytes are all normal. Please contact my office if you have any questions or concerns.   Have a great day.  Dr. Caryn Section

## 2019-09-17 NOTE — Telephone Encounter (Signed)
Tried calling patient. Left message to call back. OK for PEC to advise of results.  

## 2019-09-17 NOTE — Telephone Encounter (Signed)
Patient returned call and was read lab note by Dr Caryn Section dated 09/17/19. He verbalized understanding of all information and has no questions at this time.

## 2019-11-05 ENCOUNTER — Other Ambulatory Visit: Payer: Self-pay | Admitting: Family Medicine

## 2019-11-05 MED ORDER — ATORVASTATIN CALCIUM 40 MG PO TABS: 40.0000 mg | ORAL_TABLET | Freq: Every day | ORAL | 1 refills | Status: DC

## 2019-11-05 NOTE — Telephone Encounter (Signed)
[facility] faxed refill request for the following medications:  atorvastatin   Please advise.  Thanks, [employer]

## 2020-02-29 ENCOUNTER — Encounter: Payer: Self-pay | Admitting: Emergency Medicine

## 2020-02-29 ENCOUNTER — Ambulatory Visit (INDEPENDENT_AMBULATORY_CARE_PROVIDER_SITE_OTHER): Payer: Commercial Managed Care - PPO

## 2020-02-29 ENCOUNTER — Ambulatory Visit
Admission: EM | Admit: 2020-02-29 | Discharge: 2020-02-29 | Disposition: A | Discharge status: Home / Self Care | Payer: Commercial Managed Care - PPO | Attending: Family Medicine | Admitting: Family Medicine

## 2020-02-29 ENCOUNTER — Other Ambulatory Visit: Payer: Self-pay

## 2020-02-29 DIAGNOSIS — R059 Cough, unspecified: Secondary | ICD-10-CM

## 2020-02-29 DIAGNOSIS — R053 Chronic cough: Secondary | ICD-10-CM

## 2020-02-29 MED ORDER — ALBUTEROL SULFATE HFA 108 (90 BASE) MCG/ACT IN AERS: 1.0000 | INHALATION_SPRAY | Freq: Four times a day (QID) | RESPIRATORY_TRACT | 1 refills | Status: AC | PRN

## 2020-02-29 MED ORDER — BENZONATATE 200 MG PO CAPS: 200.0000 mg | ORAL_CAPSULE | Freq: Three times a day (TID) | ORAL | 0 refills | Status: AC | PRN

## 2020-02-29 NOTE — ED Triage Notes (Signed)
Patient c/o cough x 3-4 months. Patient did a home COVID test last Saturday and this was negative.

## 2020-02-29 NOTE — ED Provider Notes (Addendum)
[facility]    CSN: [id number] Arrival date & time: 02/29/20  1616      History   Chief Complaint Chief Complaint  Patient presents with  . Cough   HPI  64 year old male presents with cough.  3-4 months of cough.  Patient reports that it comes and goes but has been quite bothersome lately.  Associated wheezing.  No fever.  Cough is nonproductive.  No relieving factors.  No other complaints.  Past Medical History:  Diagnosis Date  . Allergy   . Hyperlipidemia     Patient Active Problem List   Diagnosis Date Noted  . Hemorrhoids 05/08/2016  . Inguinal hernia, bilateral 09/22/2015  . Melanocytic nevus of skin 02/07/2015  . Buzzing in ear 02/07/2015  . Mixed hyperlipidemia 01/31/2015  . Vitamin D deficiency 01/31/2015  . Dizziness and giddiness 07/18/2009  . H/O adenomatous polyp of colon 01/21/2004  . Hay fever 05/14/1998    Past Surgical History:  Procedure Laterality Date  . COLONOSCOPY  2016  . HERNIA REPAIR  2006  . INGUINAL HERNIA REPAIR Bilateral 10/21/2015   Procedure: Laparoscopic bilateral inguinal hernia repair;  Surgeon: Christene Lye, MD;  Location: [facility];  Service: General;  Laterality: Bilateral;       Home Medications    Prior to Admission medications   Medication Sig Start Date End Date Taking? Authorizing Provider  atorvastatin (LIPITOR) 40 MG tablet Take 1 tablet (40 mg total) by mouth daily. 11/05/19  Yes Birdie Sons, MD  Cholecalciferol (VITAMIN D) 2000 UNITS tablet Take 1 tablet by mouth in the morning and at bedtime.  08/04/13  Yes [provider]  albuterol (VENTOLIN HFA) 108 (90 Base) MCG/ACT inhaler Inhale 1-2 puffs into the lungs every 6 (six) hours as needed for wheezing or shortness of breath. 02/29/20   Coral Spikes, DO  benzonatate (TESSALON) 200 MG capsule Take 1 capsule (200 mg total) by mouth 3 (three) times daily as needed for cough. 02/29/20   Coral Spikes, DO    Family History Family  History  Problem Relation Age of Onset  . Diabetes Mother        borderline    Social History Social History   Tobacco Use  . Smoking status: Former Smoker    Quit date: 10/13/1976    Years since quitting: 43.4  . Smokeless tobacco: Never Used  Substance Use Topics  . Alcohol use: No  . Drug use: No     Allergies   Meclizine   Review of Systems Review of Systems  Respiratory: Positive for cough and wheezing.    Physical Exam Triage Vital Signs ED Triage Vitals  Enc Vitals Group     BP 02/29/20 1640 (!) 140/93     Pulse Rate 02/29/20 1640 66     Resp 02/29/20 1640 18     Temp 02/29/20 1640 98.2 F (36.8 C)     Temp Source 02/29/20 1640 Oral     SpO2 02/29/20 1640 97 %     Weight 02/29/20 1638 188 lb (85.3 kg)     Height 02/29/20 1638 5\' 7"  (1.702 m)     Head Circumference --      Peak Flow --      Pain Score 02/29/20 1638 0     Pain Loc --      Pain Edu? --      Excl. in [place]? --    Updated Vital Signs BP (!) 140/93 (BP  Location: Right Arm)   Pulse 66   Temp 98.2 F (36.8 C) (Oral)   Resp 18   Ht 5\' 7"  (1.702 m)   Wt 85.3 kg   SpO2 97%   BMI 29.44 kg/m   Visual Acuity Right Eye Distance:   Left Eye Distance:   Bilateral Distance:    Right Eye Near:   Left Eye Near:    Bilateral Near:     Physical Exam Vitals and nursing note reviewed.  Constitutional:      General: He is not in acute distress.    Appearance: Normal appearance. He is not ill-appearing.  HENT:     Head: Normocephalic and atraumatic.  Eyes:     General:        Right eye: No discharge.        Left eye: No discharge.     Conjunctiva/sclera: Conjunctivae normal.  Cardiovascular:     Rate and Rhythm: Normal rate and regular rhythm.  Pulmonary:     Effort: Pulmonary effort is normal.     Breath sounds: Wheezing present.  Neurological:     Mental Status: He is alert.  Psychiatric:        Mood and Affect: Mood normal.        Behavior: Behavior normal.    UC Treatments /  Results  Labs (all labs ordered are listed, but only abnormal results are displayed) Labs Reviewed - No data to display  EKG   Radiology DG Chest 2 View  Result Date: 02/29/2020 CLINICAL DATA:  Cough 3-4 months, negative home COVID test 2 days ago EXAM: CHEST - 2 VIEW COMPARISON:  05/13/2009 FINDINGS: Normal heart size, mediastinal contours, and pulmonary vascularity. Subsegmental atelectasis LEFT lower lobe. Remaining lungs hyperinflated but clear. No acute infiltrate, pleural effusion or pneumothorax. Scattered endplate spur formation thoracic spine. IMPRESSION: Hyperinflated lungs with subsegmental atelectasis at LEFT base. Electronically Signed   By: Lavonia Dana M.D.   On: 02/29/2020 16:57    Procedures Procedures (including critical care time)  Medications Ordered in UC Medications - No data to display  Initial Impression / Assessment and Plan / UC Course  64 year old male presents with cough. Chronic, 3-4 months. Wheezing on exam. Xray obtained and independently by me.  Interpretation: No acute infiltrate.  Albuterol and tessalon as prescribed. Follow up with PCP. May need to see Pulmonology. and the nursing notes.  Pertinent labs & imaging results that were available during my care of the patient were reviewed by me and considered in my medical decision making (see chart for details).    64 year old male presents with cough. Chronic, 3-4 months. Wheezing on exam. Xray obtained and independently by me.  Interpretation: No acute infiltrate.  Albuterol and tessalon as prescribed. Follow up with PCP. May need to see Pulmonology.  Final Clinical Impressions(s) / UC Diagnoses   Final diagnoses:  Chronic cough     Discharge Instructions     Medication as prescribed.  If does not improve, ask PCP for referral to pulmonology.  Take care  Dr. Lacinda Axon    ED Prescriptions    Medication Sig Dispense Auth. Provider   albuterol (VENTOLIN HFA) 108 (90 Base) MCG/ACT inhaler Inhale 1-2 puffs into the lungs every 6 (six) hours as needed for wheezing or shortness of breath. 18 g Rafferty Postlewait G, DO   benzonatate (TESSALON) 200 MG capsule Take 1 capsule (200 mg total) by mouth 3  (three) times daily as needed for cough. 30 capsule Coral Spikes, DO     PDMP not reviewed this encounter.     Coral Spikes, [place] 02/29/20 [phone]

## 2020-02-29 NOTE — Discharge Instructions (Signed)
Medication as prescribed.  If does not improve, ask PCP for referral to pulmonology.  Take care  Dr. Lacinda Axon

## 2020-04-14 ENCOUNTER — Other Ambulatory Visit: Payer: Self-pay | Admitting: Family Medicine

## 2020-07-05 ENCOUNTER — Other Ambulatory Visit: Payer: Self-pay | Admitting: Internal Medicine

## 2020-07-05 DIAGNOSIS — R519 Headache, unspecified: Secondary | ICD-10-CM

## 2020-07-13 ENCOUNTER — Ambulatory Visit
Admission: RE | Admit: 2020-07-13 | Discharge: 2020-07-13 | Disposition: A | Discharge status: Home / Self Care | Payer: Commercial Managed Care - PPO | Source: Ambulatory Visit | Attending: Internal Medicine | Admitting: Internal Medicine

## 2020-07-13 ENCOUNTER — Other Ambulatory Visit: Payer: Self-pay

## 2020-07-13 DIAGNOSIS — R519 Headache, unspecified: Secondary | ICD-10-CM | POA: Insufficient documentation

## 2020-10-18 ENCOUNTER — Other Ambulatory Visit: Payer: Self-pay | Admitting: Family Medicine

## 2020-10-18 NOTE — Telephone Encounter (Signed)
   Notes to clinic Not a provider we approve rx for.  

## 2021-05-16 IMAGING — CR DG CHEST 2V
2 series · 2 of 2 positions shown · non-contrast
Comparison: 05/13/2009

CLINICAL DATA: Cough 3-4 months, negative home COVID test 2 days
ago

EXAM:
CHEST - 2 VIEW

[chest pa]
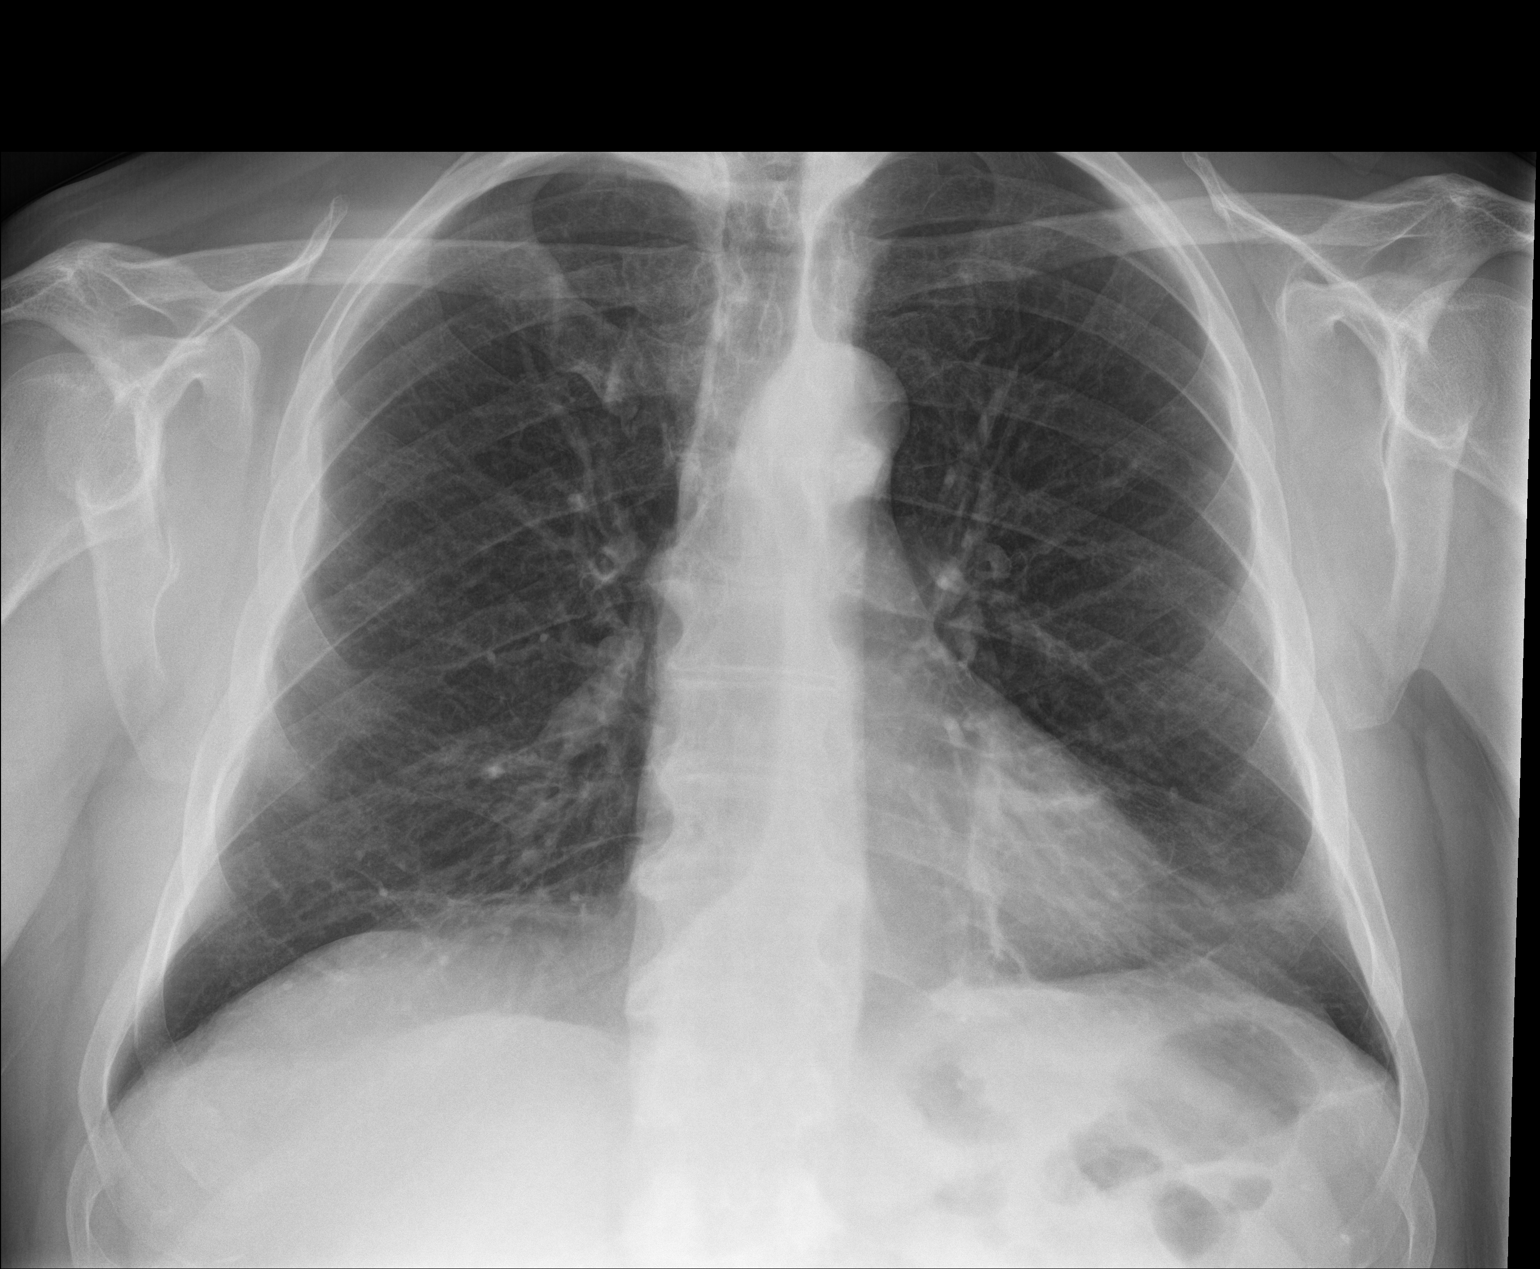

[chest lat]
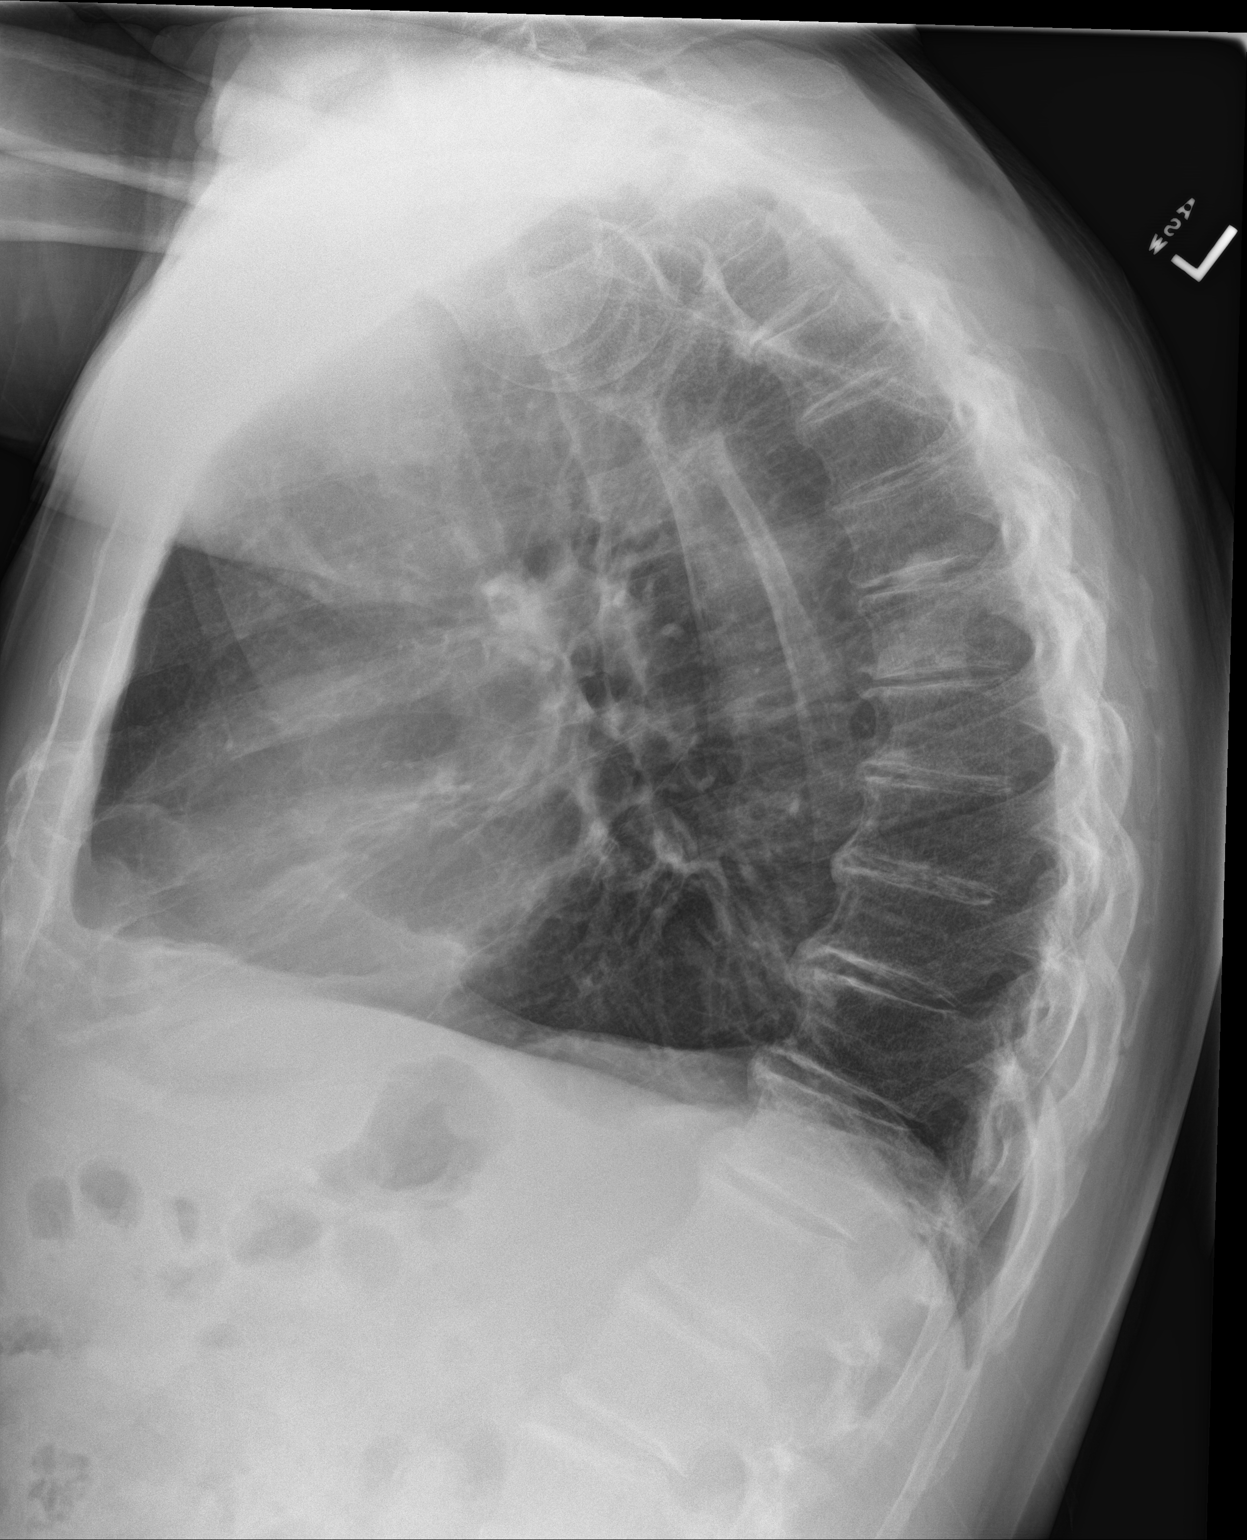

[2 of 2 positions shown; findings below may reference images not displayed]

FINDINGS: Normal heart size, mediastinal contours, and pulmonary vascularity.

Subsegmental atelectasis LEFT lower lobe.

Remaining lungs hyperinflated but clear.

No acute infiltrate, pleural effusion or pneumothorax.

Scattered endplate spur formation thoracic spine.
IMPRESSION: Hyperinflated lungs with subsegmental atelectasis at LEFT base.

## 2021-09-28 IMAGING — CT CT HEAD W/O CM
4 series · 14 of 47 positions shown, 16 images · non-contrast
Comparison: None.

CLINICAL DATA: Head pain

EXAM:
CT HEAD WITHOUT CONTRAST
TECHNIQUE: Contiguous axial images were obtained from the base of the skull
through the vertex without intravenous contrast.

[Series 2: axial st head 5.00 ax · axial · 0.35mm/px · z∈[-448,-354]mm · 6 of 28 slices shown, 8 images]
[im 4/28  brain]
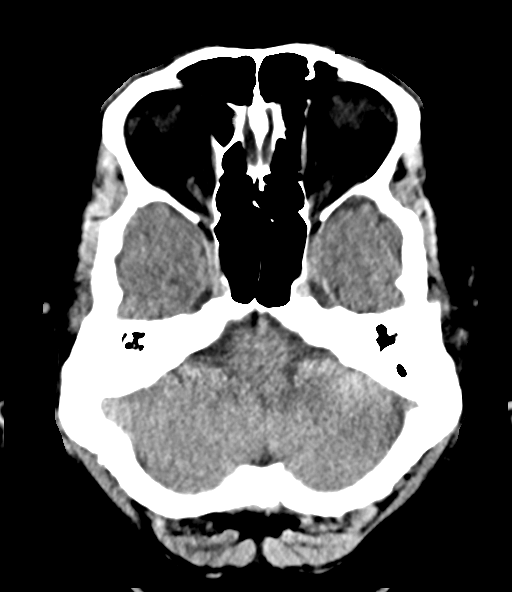
[im 4/28  bone]
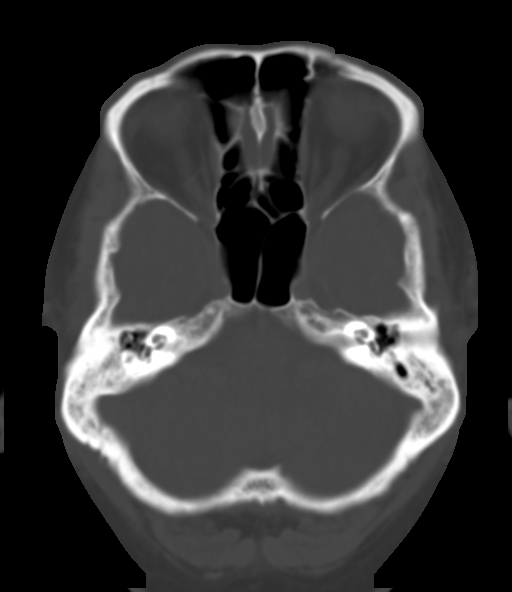
[im 8/28  brain]
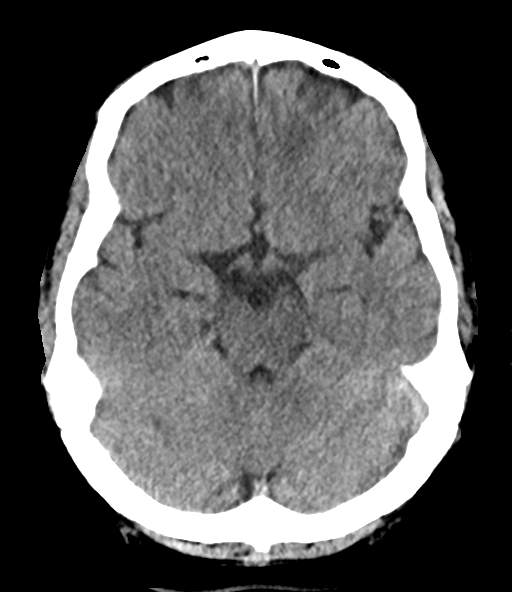
[im 12/28  brain]
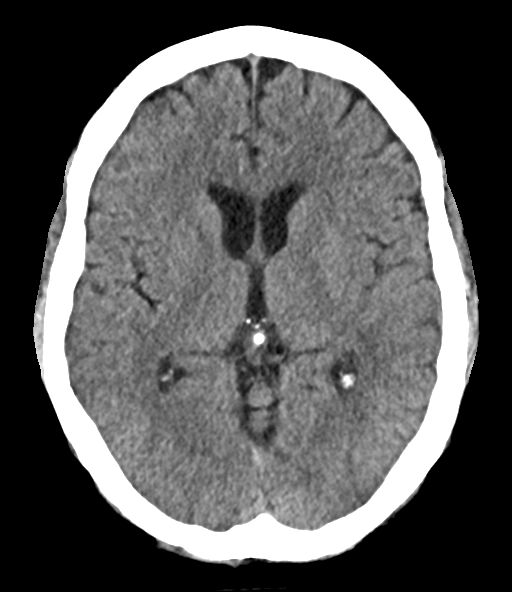
[im 16/28  brain]
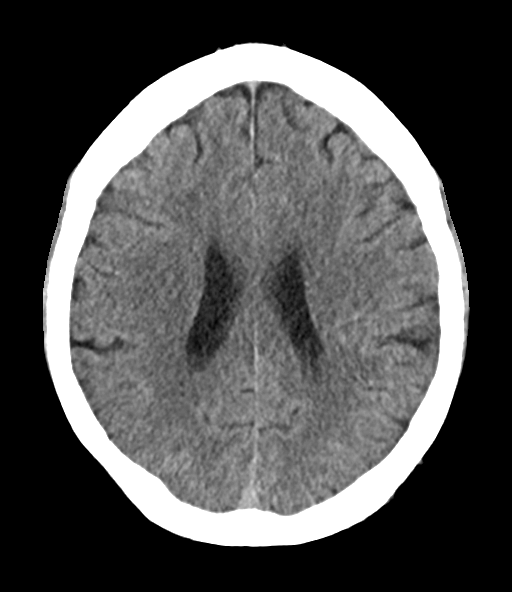
[im 20/28  brain]
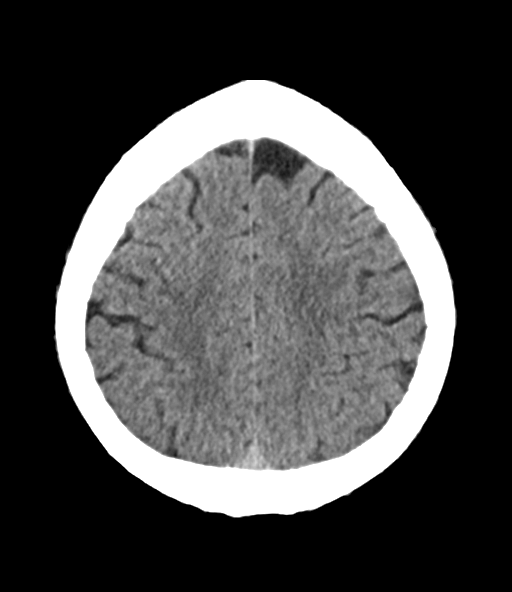
[im 20/28  bone]
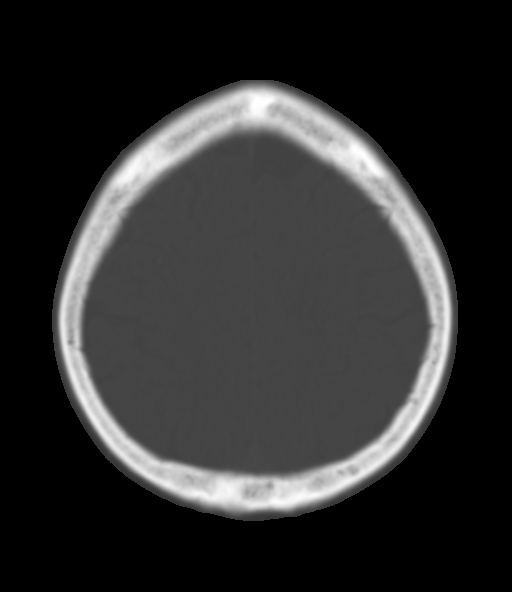
[im 24/28  brain]
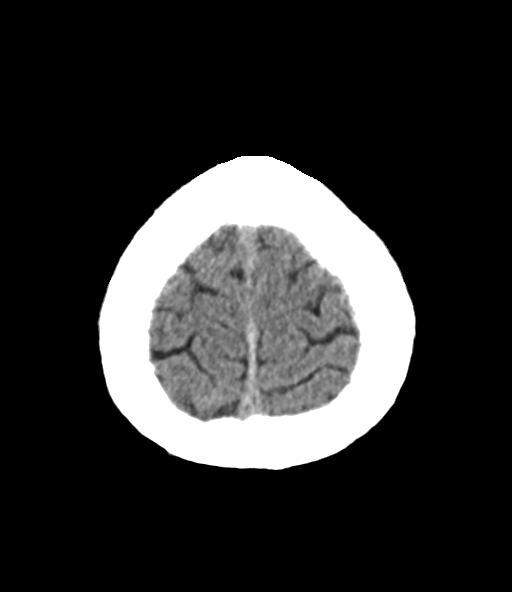

[Series 4: bone windows head 2.00 ax · axial · 0.35mm/px · z∈[-452,-439]mm · 2 of 72 slices shown]
[im 7/72  bone]
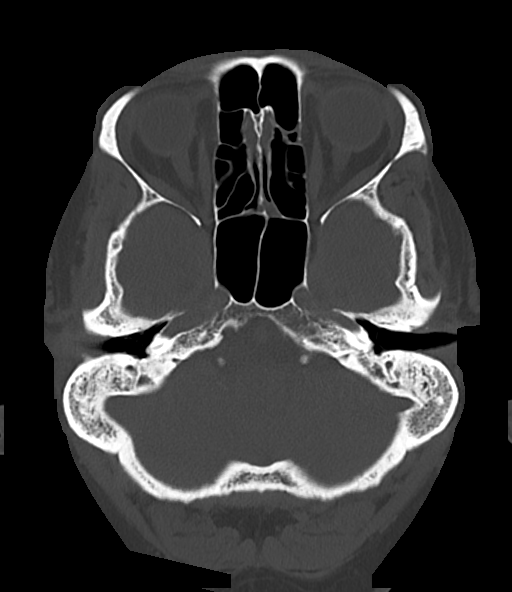
[im 14/72  bone]
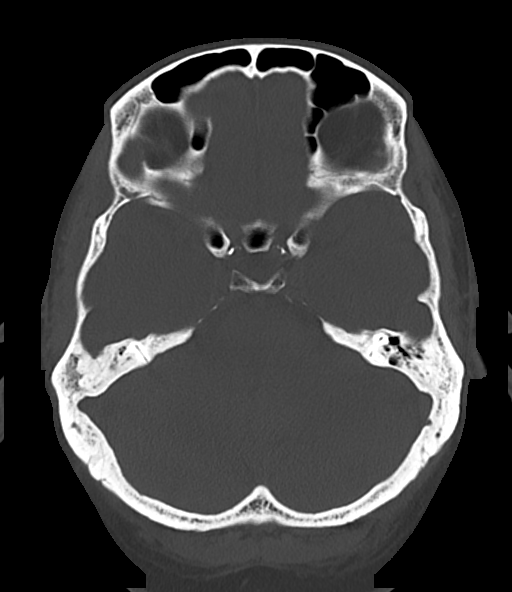

[Series 6: coronals head 3.00 cor · coronal · 0.28mm/px · 3 of 69 slices shown]
[im 23/69  brain]
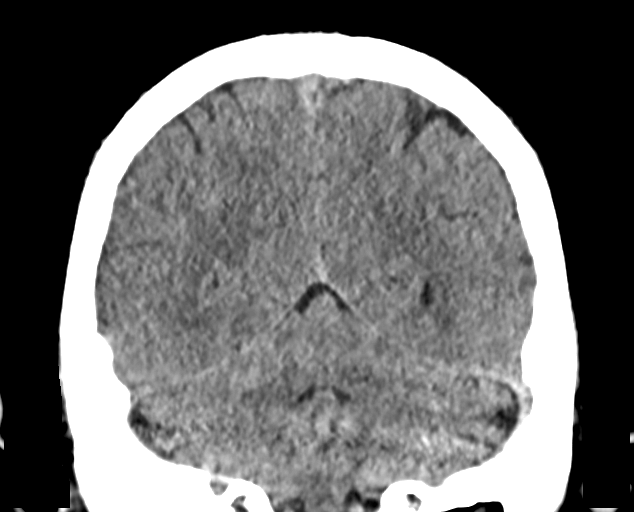
[im 31/69  brain]
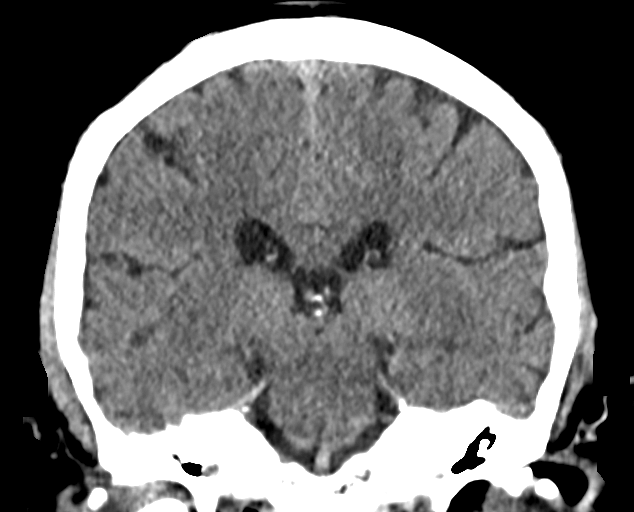
[im 38/69  brain]
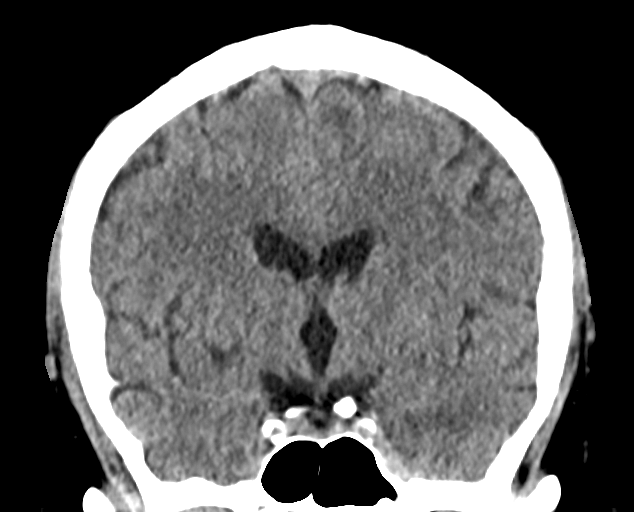

[Series 8: sagittals head 3.00 sag · sagittal · 0.28mm/px · 3 of 59 slices shown]
[im 20/59  brain]
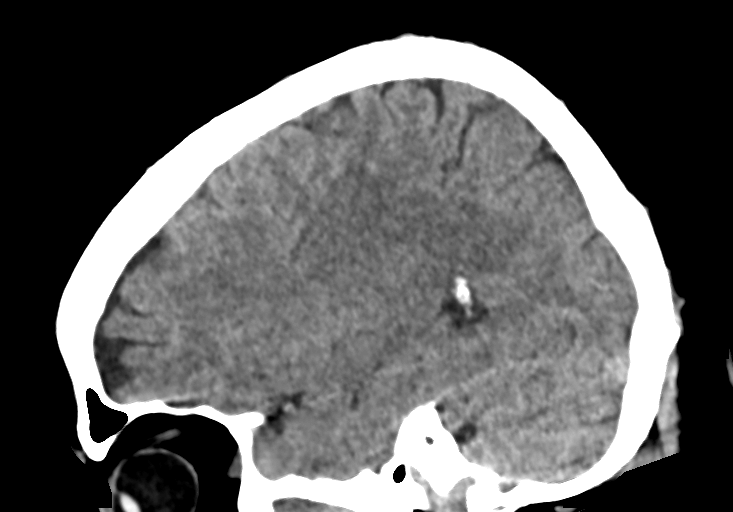
[im 30/59  brain]
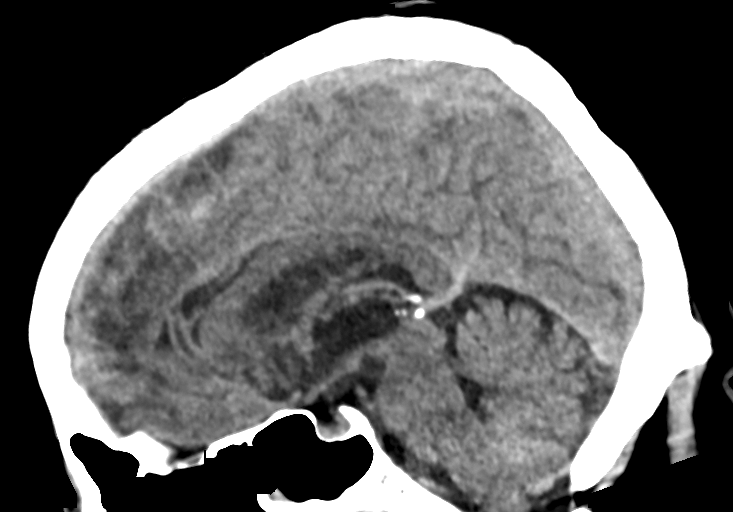
[im 39/59  brain]
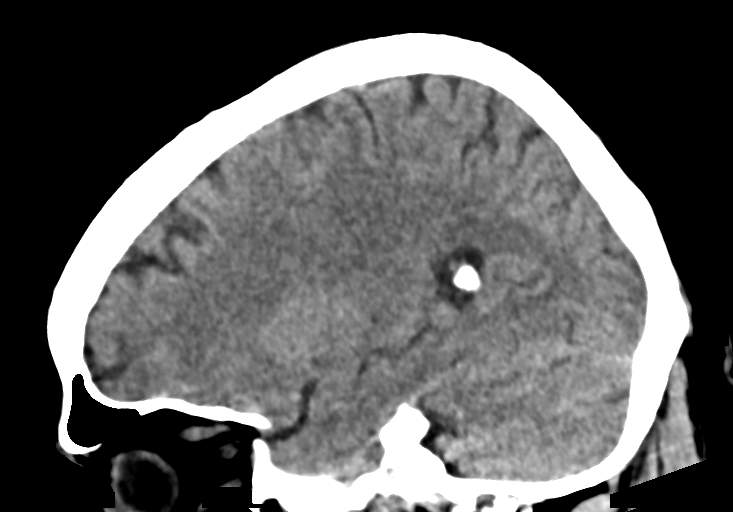

[14 of 47 positions shown; findings below may reference images not displayed]

FINDINGS: Brain: No acute infarct or intracranial hemorrhage. No mass lesion.
No midline shift, ventriculomegaly or extra-axial fluid collection.

Vascular: No hyperdense vessel or unexpected calcification.

Skull: Negative for fracture or focal lesion.

Sinuses/Orbits: Normal orbits. Minimal ethmoid sinus mucosal
thickening.

Other: None.
IMPRESSION: Minimal sinus mucosal thickening.

Otherwise unremarkable head CT.

## 2022-05-28 DIAGNOSIS — M5010 Cervical disc disorder with radiculopathy, unspecified cervical region: Secondary | ICD-10-CM | POA: Diagnosis not present

## 2022-05-28 DIAGNOSIS — R739 Hyperglycemia, unspecified: Secondary | ICD-10-CM | POA: Diagnosis not present

## 2022-05-28 DIAGNOSIS — E785 Hyperlipidemia, unspecified: Secondary | ICD-10-CM | POA: Diagnosis not present

## 2022-05-28 DIAGNOSIS — E559 Vitamin D deficiency, unspecified: Secondary | ICD-10-CM | POA: Diagnosis not present

## 2022-06-04 DIAGNOSIS — Z85820 Personal history of malignant melanoma of skin: Secondary | ICD-10-CM | POA: Diagnosis not present

## 2022-06-04 DIAGNOSIS — R7303 Prediabetes: Secondary | ICD-10-CM | POA: Diagnosis not present

## 2022-06-04 DIAGNOSIS — Z0000 Encounter for general adult medical examination without abnormal findings: Secondary | ICD-10-CM | POA: Diagnosis not present

## 2022-06-04 DIAGNOSIS — E559 Vitamin D deficiency, unspecified: Secondary | ICD-10-CM | POA: Diagnosis not present

## 2022-06-04 DIAGNOSIS — E785 Hyperlipidemia, unspecified: Secondary | ICD-10-CM | POA: Diagnosis not present

## 2022-07-30 DIAGNOSIS — L570 Actinic keratosis: Secondary | ICD-10-CM | POA: Diagnosis not present

## 2022-07-30 DIAGNOSIS — Z872 Personal history of diseases of the skin and subcutaneous tissue: Secondary | ICD-10-CM | POA: Diagnosis not present

## 2022-07-30 DIAGNOSIS — L578 Other skin changes due to chronic exposure to nonionizing radiation: Secondary | ICD-10-CM | POA: Diagnosis not present

## 2022-07-30 DIAGNOSIS — B360 Pityriasis versicolor: Secondary | ICD-10-CM | POA: Diagnosis not present

## 2022-07-30 DIAGNOSIS — Z85820 Personal history of malignant melanoma of skin: Secondary | ICD-10-CM | POA: Diagnosis not present

## 2022-07-30 DIAGNOSIS — R21 Rash and other nonspecific skin eruption: Secondary | ICD-10-CM | POA: Diagnosis not present

## 2022-11-26 DIAGNOSIS — E559 Vitamin D deficiency, unspecified: Secondary | ICD-10-CM | POA: Diagnosis not present

## 2022-11-26 DIAGNOSIS — R7303 Prediabetes: Secondary | ICD-10-CM | POA: Diagnosis not present

## 2022-11-26 DIAGNOSIS — M5010 Cervical disc disorder with radiculopathy, unspecified cervical region: Secondary | ICD-10-CM | POA: Diagnosis not present

## 2022-11-26 DIAGNOSIS — E785 Hyperlipidemia, unspecified: Secondary | ICD-10-CM | POA: Diagnosis not present

## 2022-11-26 DIAGNOSIS — Z0000 Encounter for general adult medical examination without abnormal findings: Secondary | ICD-10-CM | POA: Diagnosis not present

## 2022-12-03 DIAGNOSIS — E559 Vitamin D deficiency, unspecified: Secondary | ICD-10-CM | POA: Diagnosis not present

## 2022-12-03 DIAGNOSIS — E785 Hyperlipidemia, unspecified: Secondary | ICD-10-CM | POA: Diagnosis not present

## 2022-12-03 DIAGNOSIS — Z8739 Personal history of other diseases of the musculoskeletal system and connective tissue: Secondary | ICD-10-CM | POA: Diagnosis not present

## 2022-12-03 DIAGNOSIS — Z85820 Personal history of malignant melanoma of skin: Secondary | ICD-10-CM | POA: Diagnosis not present

## 2022-12-03 DIAGNOSIS — R7303 Prediabetes: Secondary | ICD-10-CM | POA: Diagnosis not present

## 2022-12-03 DIAGNOSIS — Z125 Encounter for screening for malignant neoplasm of prostate: Secondary | ICD-10-CM | POA: Diagnosis not present

## 2023-01-24 DIAGNOSIS — L570 Actinic keratosis: Secondary | ICD-10-CM | POA: Diagnosis not present

## 2023-01-24 DIAGNOSIS — B360 Pityriasis versicolor: Secondary | ICD-10-CM | POA: Diagnosis not present

## 2023-01-24 DIAGNOSIS — Z872 Personal history of diseases of the skin and subcutaneous tissue: Secondary | ICD-10-CM | POA: Diagnosis not present

## 2023-01-24 DIAGNOSIS — Z85820 Personal history of malignant melanoma of skin: Secondary | ICD-10-CM | POA: Diagnosis not present

## 2023-01-24 DIAGNOSIS — L578 Other skin changes due to chronic exposure to nonionizing radiation: Secondary | ICD-10-CM | POA: Diagnosis not present

## 2023-03-04 DIAGNOSIS — J3089 Other allergic rhinitis: Secondary | ICD-10-CM | POA: Diagnosis not present

## 2023-03-04 DIAGNOSIS — J3489 Other specified disorders of nose and nasal sinuses: Secondary | ICD-10-CM | POA: Diagnosis not present

## 2023-03-04 DIAGNOSIS — J329 Chronic sinusitis, unspecified: Secondary | ICD-10-CM | POA: Diagnosis not present

## 2023-05-11 DIAGNOSIS — R0689 Other abnormalities of breathing: Secondary | ICD-10-CM | POA: Diagnosis not present

## 2023-05-11 DIAGNOSIS — R062 Wheezing: Secondary | ICD-10-CM | POA: Diagnosis not present

## 2023-05-11 DIAGNOSIS — R0602 Shortness of breath: Secondary | ICD-10-CM | POA: Diagnosis not present

## 2023-06-27 DIAGNOSIS — Z85820 Personal history of malignant melanoma of skin: Secondary | ICD-10-CM | POA: Diagnosis not present

## 2023-06-27 DIAGNOSIS — E785 Hyperlipidemia, unspecified: Secondary | ICD-10-CM | POA: Diagnosis not present

## 2023-06-27 DIAGNOSIS — Z125 Encounter for screening for malignant neoplasm of prostate: Secondary | ICD-10-CM | POA: Diagnosis not present

## 2023-06-27 DIAGNOSIS — R7303 Prediabetes: Secondary | ICD-10-CM | POA: Diagnosis not present

## 2023-06-27 DIAGNOSIS — M5010 Cervical disc disorder with radiculopathy, unspecified cervical region: Secondary | ICD-10-CM | POA: Diagnosis not present

## 2023-07-30 DIAGNOSIS — Z1331 Encounter for screening for depression: Secondary | ICD-10-CM | POA: Diagnosis not present

## 2023-07-30 DIAGNOSIS — Z2821 Immunization not carried out because of patient refusal: Secondary | ICD-10-CM | POA: Diagnosis not present

## 2023-07-30 DIAGNOSIS — E785 Hyperlipidemia, unspecified: Secondary | ICD-10-CM | POA: Diagnosis not present

## 2023-07-30 DIAGNOSIS — Z0000 Encounter for general adult medical examination without abnormal findings: Secondary | ICD-10-CM | POA: Diagnosis not present

## 2023-07-30 DIAGNOSIS — M5010 Cervical disc disorder with radiculopathy, unspecified cervical region: Secondary | ICD-10-CM | POA: Diagnosis not present

## 2023-07-30 DIAGNOSIS — Z85820 Personal history of malignant melanoma of skin: Secondary | ICD-10-CM | POA: Diagnosis not present

## 2023-07-30 DIAGNOSIS — R7303 Prediabetes: Secondary | ICD-10-CM | POA: Diagnosis not present

## 2023-07-30 DIAGNOSIS — E559 Vitamin D deficiency, unspecified: Secondary | ICD-10-CM | POA: Diagnosis not present

## 2023-08-27 DIAGNOSIS — Z85820 Personal history of malignant melanoma of skin: Secondary | ICD-10-CM | POA: Diagnosis not present

## 2023-08-27 DIAGNOSIS — L570 Actinic keratosis: Secondary | ICD-10-CM | POA: Diagnosis not present

## 2023-08-27 DIAGNOSIS — B360 Pityriasis versicolor: Secondary | ICD-10-CM | POA: Diagnosis not present

## 2023-08-27 DIAGNOSIS — Z872 Personal history of diseases of the skin and subcutaneous tissue: Secondary | ICD-10-CM | POA: Diagnosis not present

## 2023-08-27 DIAGNOSIS — L578 Other skin changes due to chronic exposure to nonionizing radiation: Secondary | ICD-10-CM | POA: Diagnosis not present

## 2023-12-19 DIAGNOSIS — H5213 Myopia, bilateral: Secondary | ICD-10-CM | POA: Diagnosis not present

## 2023-12-19 DIAGNOSIS — Z0100 Encounter for examination of eyes and vision without abnormal findings: Secondary | ICD-10-CM | POA: Diagnosis not present

## 2024-01-03 DIAGNOSIS — Z860101 Personal history of adenomatous and serrated colon polyps: Secondary | ICD-10-CM | POA: Diagnosis not present

## 2024-01-03 DIAGNOSIS — K219 Gastro-esophageal reflux disease without esophagitis: Secondary | ICD-10-CM | POA: Diagnosis not present

## 2024-01-03 DIAGNOSIS — R634 Abnormal weight loss: Secondary | ICD-10-CM | POA: Diagnosis not present

## 2024-01-27 DIAGNOSIS — Z85820 Personal history of malignant melanoma of skin: Secondary | ICD-10-CM | POA: Diagnosis not present

## 2024-01-27 DIAGNOSIS — R7303 Prediabetes: Secondary | ICD-10-CM | POA: Diagnosis not present

## 2024-01-27 DIAGNOSIS — E785 Hyperlipidemia, unspecified: Secondary | ICD-10-CM | POA: Diagnosis not present

## 2024-01-27 DIAGNOSIS — M5010 Cervical disc disorder with radiculopathy, unspecified cervical region: Secondary | ICD-10-CM | POA: Diagnosis not present

## 2024-02-03 DIAGNOSIS — Z125 Encounter for screening for malignant neoplasm of prostate: Secondary | ICD-10-CM | POA: Diagnosis not present

## 2024-02-03 DIAGNOSIS — E785 Hyperlipidemia, unspecified: Secondary | ICD-10-CM | POA: Diagnosis not present

## 2024-02-03 DIAGNOSIS — M5010 Cervical disc disorder with radiculopathy, unspecified cervical region: Secondary | ICD-10-CM | POA: Diagnosis not present

## 2024-02-03 DIAGNOSIS — R0789 Other chest pain: Secondary | ICD-10-CM | POA: Diagnosis not present

## 2024-02-03 DIAGNOSIS — E559 Vitamin D deficiency, unspecified: Secondary | ICD-10-CM | POA: Diagnosis not present

## 2024-02-03 DIAGNOSIS — R7303 Prediabetes: Secondary | ICD-10-CM | POA: Diagnosis not present

## 2024-02-03 DIAGNOSIS — Z85820 Personal history of malignant melanoma of skin: Secondary | ICD-10-CM | POA: Diagnosis not present

## 2024-02-12 DIAGNOSIS — R0789 Other chest pain: Secondary | ICD-10-CM | POA: Diagnosis not present
